# Patient Record
Sex: Female | Born: 1935 | Race: Black or African American | Hispanic: No | Smoking: Never smoker
Health system: Southern US, Community
[De-identification: ages and names within clinical notes are randomized; demographics above are authoritative.]

## PROBLEM LIST (undated history)

## (undated) DIAGNOSIS — F0390 Unspecified dementia without behavioral disturbance: Secondary | ICD-10-CM

## (undated) DIAGNOSIS — C259 Malignant neoplasm of pancreas, unspecified: Secondary | ICD-10-CM

## (undated) DIAGNOSIS — I10 Essential (primary) hypertension: Secondary | ICD-10-CM

## (undated) DIAGNOSIS — M1990 Unspecified osteoarthritis, unspecified site: Secondary | ICD-10-CM

## (undated) HISTORY — PX: SHOULDER SURGERY: SHX246

## (undated) HISTORY — PX: CHOLECYSTECTOMY: SHX55

## (undated) HISTORY — PX: ABDOMINAL HYSTERECTOMY: SHX81

---

## 2002-08-06 ENCOUNTER — Emergency Department (HOSPITAL_COMMUNITY): Admission: EM | Admit: 2002-08-06 | Discharge: 2002-08-07 | Payer: Self-pay | Admitting: Emergency Medicine

## 2002-08-07 ENCOUNTER — Encounter: Payer: Self-pay | Admitting: Emergency Medicine

## 2005-08-16 ENCOUNTER — Ambulatory Visit (HOSPITAL_COMMUNITY): Admission: RE | Admit: 2005-08-16 | Discharge: 2005-08-16 | Payer: Self-pay | Admitting: Family Medicine

## 2006-07-12 ENCOUNTER — Emergency Department (HOSPITAL_COMMUNITY): Admission: EM | Admit: 2006-07-12 | Discharge: 2006-07-12 | Payer: Self-pay | Admitting: Emergency Medicine

## 2007-02-10 ENCOUNTER — Emergency Department (HOSPITAL_COMMUNITY): Admission: EM | Admit: 2007-02-10 | Discharge: 2007-02-10 | Payer: Self-pay | Admitting: Emergency Medicine

## 2007-02-11 ENCOUNTER — Ambulatory Visit (HOSPITAL_COMMUNITY): Admission: RE | Admit: 2007-02-11 | Discharge: 2007-02-11 | Payer: Self-pay | Admitting: Family Medicine

## 2007-02-13 ENCOUNTER — Inpatient Hospital Stay (HOSPITAL_COMMUNITY): Admission: RE | Admit: 2007-02-13 | Discharge: 2007-02-14 | Payer: Self-pay | Admitting: Urgent Care

## 2007-02-13 ENCOUNTER — Ambulatory Visit: Payer: Self-pay | Admitting: Gastroenterology

## 2007-02-14 ENCOUNTER — Ambulatory Visit: Payer: Self-pay | Admitting: Gastroenterology

## 2007-02-24 ENCOUNTER — Ambulatory Visit: Payer: Self-pay | Admitting: Gastroenterology

## 2007-02-24 ENCOUNTER — Ambulatory Visit (HOSPITAL_COMMUNITY): Admission: RE | Admit: 2007-02-24 | Discharge: 2007-02-24 | Payer: Self-pay | Admitting: Gastroenterology

## 2007-03-26 ENCOUNTER — Ambulatory Visit: Payer: Self-pay | Admitting: Internal Medicine

## 2007-12-23 DIAGNOSIS — I10 Essential (primary) hypertension: Secondary | ICD-10-CM | POA: Insufficient documentation

## 2007-12-23 DIAGNOSIS — R634 Abnormal weight loss: Secondary | ICD-10-CM

## 2007-12-23 DIAGNOSIS — R1310 Dysphagia, unspecified: Secondary | ICD-10-CM | POA: Insufficient documentation

## 2007-12-23 DIAGNOSIS — K222 Esophageal obstruction: Secondary | ICD-10-CM | POA: Insufficient documentation

## 2008-01-13 DIAGNOSIS — R112 Nausea with vomiting, unspecified: Secondary | ICD-10-CM

## 2008-01-13 DIAGNOSIS — K219 Gastro-esophageal reflux disease without esophagitis: Secondary | ICD-10-CM | POA: Insufficient documentation

## 2008-01-13 DIAGNOSIS — Z8719 Personal history of other diseases of the digestive system: Secondary | ICD-10-CM

## 2008-01-13 DIAGNOSIS — N281 Cyst of kidney, acquired: Secondary | ICD-10-CM

## 2008-01-13 DIAGNOSIS — R109 Unspecified abdominal pain: Secondary | ICD-10-CM | POA: Insufficient documentation

## 2010-10-02 NOTE — Assessment & Plan Note (Signed)
NAMEMarland Kitchen  Sophia, Ramirez              CHART#:  [id number]   DATE:  03/26/2007                       DOB:  [birth year]   CHIEF COMPLAINT:  Followup EGD.   SUBJECTIVE:  The patient is a 77 year old female.  She was evaluated by  myself and Dr. Kassie Mends for weight loss, upper abdominal pain, nausea  and vomiting.  She had a CT scan on 02/13/2007.  She was found to have  intra and extrahepatic biliary dilatation, status post cholecystectomy  with tapering of the distal CBD to a slightly prominent ampulla.  Renal  cyst, nonspecific stranding of fat surrounding the SMA.  It was  suggested that she have EUS either in [place] or at [facility].  She has declined any further workup for CBD dilatation.  She did have an  EGD with Savary dilatation by Dr. Cira Servant on 02/14/2007.  Her distal  esophagus was narrowed to approximately 10 mm.  She had a difficult  Schatzki's ring which was successfully dilated to 12.8 mm with Savary  dilator.  She is due for followup EGD with esophageal dilatation at this  time however she is declining this.  She tells me she is doing great.  She denies any nausea or vomiting.  Denies any dysphagia or odynophagia.  Denies any abdominal pain.  She denies any rectal bleeding or melena.  She denies any anorexia or fatigue.  Overall she feels quite well.   CURRENT MEDICATIONS:  See the list from 03/26/2007.   ALLERGIES:  ANTIBIOTIC WHICH SHE CANNOT REMEMBER THE NAME.   OBJECTIVE:  VITAL SIGNS:  Weight 177 pounds.  Height 54 inches.  Temp  97.8.  Blood pressure 140/80.  Pulse 80.  GENERAL:  The patient is an elderly [place] American female who is  alert, oriented, pleasant, cooperative, in no acute distress.  HEENT:  Sclerae are clear, nonicteric.  Conjunctivae pink.  Oropharynx  pink and moist without any lesions.  HEART:  Regular rate and rhythm.  Normal S1 and S2.  ABDOMEN:  Positive bowel sounds x4.  No bruits auscultated.  Soft,  nontender, nondistended  without palpable mass or hepatosplenomegaly.  No  rebound, tenderness or guarding.  EXTREMITIES:  Without clubbing or edema bilaterally.  SKIN:  Warm and dry without any rash or jaundice.   ASSESSMENT:  1. Chronic complicated gastroesophageal reflux disease with critical      Schatzki's ring which was dilated by Dr. Cira Servant 02/14/2007.  The      patient has declined further dilatation at this time.  She tells me      if she has any further problems she will call us.  2. Her nausea and vomiting has resolved.  She is doing well on      Protonix 40 mg daily.  3. Common bile duct dilatation-likely benign papillary stenosis and      low likelihood of stone or mass.  Referral for EUS has been      canceled on the part of the patient as she is requesting no further      workup.   PLAN:  1. Continue Protonix 40 mg daily, #31 with 11 refills.  2. If she changes her mind regarding EUS or repeat EGD with dilatation      she is going to give [place] a call.  3. Otherwise, will follow up  with her in 1 year.       Lorenza Burton, N.P.  Electronically Signed     Kassie Mends, M.D.  Electronically Signed    KJ/MEDQ  D:  03/26/2007  T:  03/26/2007  Job:  [id number]   cc:   Deniece Portela A. Sheffield Slider, M.D.

## 2010-10-02 NOTE — Op Note (Signed)
NAMESAMIAH, RICKLEFS             ACCOUNT NO.:  [id number]   MEDICAL RECORD NO.:  [id number]          PATIENT TYPE:  AMB   LOCATION:  DAY                           FACILITY:  [facility]   PHYSICIAN:  Kassie Mends, M.D.      DATE OF BIRTH:  [birth year]   DATE OF PROCEDURE:  02/24/2007  DATE OF DISCHARGE:                               OPERATIVE REPORT   PROCEDURE:  Esophagastroduodenoscopy with Savary Dilation   REFERRING PHYSICIAN:  Annia Friendly. Loleta Chance, M.D.   INDICATIONS:  Mrs. Bouska is a 77 year old female who presented as an  outpatient for abdominal pain, heartburn and vomiting.  She had an upper  endoscopy on February 14, 2007 which showed a distal esophageal ring  which was dilated 12.8 mm.  She returns for subsequent dilation.   FINDINGS:  1. Distal esophagus narrowed to approximately 12 to 13 mm.  The distal      esophagus was successively dilated using the Savary dilators from      12.8 mm to 16 mm.  The dilators passed with mild resistance.      Otherwise no evidence of Barrett's erosions, mass, or ulceration      seen in the distal esophagus.  2. Normal stomach without evidence of erosion or ulcerations.  3. Normal duodenal bulb and second portion of the duodenum.   RECOMMENDATIONS:  1. She may progress to a regular diet.  2. She may use Protonix as needed for heartburn or indigestion.  3. No aspirin, NSAIDs or anticoagulation for 7 days.  4. Return patient visit with Lorenza Burton in 1 month to reassess      dysphagia and need for additional dilation.  5. Endoscopic ultrasound in [facility] for intrahepatic and      extrahepatic biliary dilation status post cholecystectomy.   MEDICATIONS:  1. Demerol IV.  2. Versed IV.   PROCEDURE TECHNIQUE:  Physical exam was performed.  Informed consent was  obtained from the patient after explaining the benefits, risks and  alternatives to the procedure.  The patient was connected to the monitor  and placed in the left lateral  position.  Continuous oxygen was provided  by nasal cannula and IV medicine administered through an indwelling  cannula.  After administration of sedation, the patient was intubated.  The scope was advanced under direct visualization to the second portion  of the duodenum.  The scope was removed slowly by carefully examining  the color, texture, anatomy, and integrity mucosa on the way out.  The  scope was advanced into the antrum and the Savary guidewire was  introduced.  The dilators were placed successively from 12.8 mm to 16  mm.  The scope was then removed.  Scope was withdrawn and the Savary  dilators were introduced over the guide wire.  The guidewire and the  Savary dilators were removed.  The patient was recovered in endoscopy  and discharged home in satisfactory condition.      Kassie Mends, M.D.  Electronically Signed     SM/MEDQ  D:  02/24/2007  T:  02/24/2007  Job:  [id number]   cc:  Barrie Folk. Berdine Addison, MD  Fax: [phone]

## 2010-10-02 NOTE — Discharge Summary (Signed)
Sophia Ramirez, Sophia Ramirez             ACCOUNT NO.:  [id number]   MEDICAL RECORD NO.:  [id number]          PATIENT TYPE:  INP   LOCATION:  A307                          FACILITY:  [facility]   PHYSICIAN:  Kassie Mends, M.D.      DATE OF BIRTH:  Dec 01, 1925   DATE OF ADMISSION:  02/13/2007  DATE OF DISCHARGE:  09/27/2008LH                               DISCHARGE SUMMARY   DISCHARGE DIAGNOSES:  1. Schatzki ring and dilated to 12.8 mm.  2. Dysphagia secondary to Schatzki ring.  3. Hypertension.  4. Cholecystectomy.   HISTORY OF PRESENT ILLNESS:  Sophia Ramirez is a 77 year old female who  presented with solid dysphagia and intermittent vomiting as well as  abdominal pain and weight loss.  She was admitted to the hospital  secondary to an inability to keep any p.o.'s down.   HOSPITAL COURSE:  She was admitted and kept n.p.o.  She was placed on IV  fluids and had antiemetics and pain medicine written for as needed.  Over the night she did not use any antiemetics or pain medicines.  She  had an EGD on the day of discharge which showed a distal Schatzki ring,  which was dilated to 12.8 mm.  Following dilation she was afebrile,  hemodynamically stable and had no chest pain.   DISCHARGE MEDICATIONS:  1..  She should avoid aspirin, NSAIDs,  anticoagulation until her dilations are complete.  1. Verapamil 240 mg in the morning and 120 mg in the evening.  2. Xanax 0.25 mg t.i.d. and as needed.  3. Ramipril 5 mg t.i.d.  4. Protonix 40 mg daily.  5. Phenergan 25 mg as needed.  6. Tylenol as needed.   DISCHARGE DIET:  She is asked to only eat meats if they are chopped,  ground or pulled.  She may otherwise follow a soft diet.      Kassie Mends, M.D.  Electronically Signed     SM/MEDQ  D:  02/16/2007  T:  02/16/2007  Job:  [id number]   cc:   Annia Friendly. Loleta Chance, MD  Fax: [phone]

## 2010-10-02 NOTE — Consult Note (Signed)
NAMESASHIA, CAMPAS             ACCOUNT NO.:  [id number]   MEDICAL RECORD NO.:  [id number]          PATIENT TYPE:  INP   LOCATION:  A307                          FACILITY:  [facility]   PHYSICIAN:  Kassie Mends, M.D.      DATE OF BIRTH:  May 04, 1926   DATE OF CONSULTATION:  DATE OF DISCHARGE:  02/14/2007                                 CONSULTATION   REQUESTING PHYSICIAN:  Dr. Loleta Chance.   REASON FOR CONSULTATION:  Severe upper abdominal pain, weight loss,  anorexia, early satiety, weakness.   HISTORY OF PRESENT ILLNESS:  Ms. Mcglown is a 77 year old African  American female.  She began to have severe upper abdominal pain Monday  evening.  She was seen by Dr. Loleta Chance 3 days ago; she was sent to the [facility] Emergency Room on February 11, 2007.  She had plain abdominal  films which were benign.  She had a normal CBC and normal LFTs and a  normal CMP, except for a glucose of 451.  She had a normal amylase and  lipase.  She was given Phenergan for the nausea and vomiting, as she had  had several episodes of nausea and vomiting throughout the week.  She  also complains of constant heartburn; she started Protonix 40 mg b.i.d.  2 days ago.  She has not noticed any change in her symptoms.  She tells  me she feels like my food won't go done.  She is having problems with  solids and feels as though they get stuck in her lower esophagus.  She  denies any problems with liquids.  She tells me has had dysphagia for  about a year now.  She had a bowel movement 2 days ago which was soft  and brown.  Abdominal film did show her colon was full of stool.  She  describes the pain currently as a dull ache to her epigastrium.  She has  lost 12 pounds in the last month.  She is only eating 2 meals a day this  month, but since she has had abdominal pain, her family says she has not  been able to keep down hardly anything.  Her pain is worse with eating.  She complains of significant bloating.  She also complains  of a mid back  pain which accompanies the epigastric pain.  She complains of early  satiety and anorexia.  She denies any fever or chills.   PAST MEDICAL AND SURGICAL HISTORY:  1. Hypertension.  2. Dislocated left shoulder with reduction.  3. Hysterectomy.  4. Cholecystectomy.  5. She had 2 tumors removed from her right temple.   CURRENT MEDICATIONS:  1. Verapamil 240 mg in the morning and 120 mg in the evening.  2. Xanax 0.25 mg t.i.d. p.r.n.  3. Ramipril 5 mg t.i.d.  4. Protonix 40 mg b.i.d.  5. Phenergan 25 mg p.r.n.  6. Tylenol p.r.n.   ALLERGIES:  She is allergic to an antibiotic that she cannot recall the  name.   FAMILY HISTORY:  There is no known family history of colorectal  carcinoma, liver or chronic GI  problems.   SOCIAL HISTORY:  Ms. Goding is a widow.  She lives alone.  She has 5 healthy children.  She has been a homemaker.  She denies any tobacco, alcohol or drug use.   REVIEW OF SYSTEMS:  See HPI.  GENERAL:  She complains of significant  weakness.  GU:  She was having some problems with dysuria.  Urinalysis  at [facility] showed small blood and trace leukocytes, no evidence of  UTI.  CARDIOVASCULAR:  She denies any chest pain or palpitations.  PULMONARY:  She denies any shortness of breath, dyspnea, cough or  hemoptysis.  GI:  See HPI.   PHYSICAL EXAMINATION:  VITAL SIGNS:  Weight 170 pounds, height 64  inches.  Temperature 98.2, blood pressure 100/64 and pulse 80.  GENERAL:  Ms. Apodaca is a pale African American who is alert,  oriented, pleasant and cooperative.  She is accompanied by her 2  daughters today.  She does appear quite weak.  HEENT:  Pupils are equal.  Sclerae anicteric.  Conjunctivae pink.  Oropharynx pink and moist.  She has upper dentures intact.  NECK:  Supple without any mass or thyromegaly.  CHEST:  Heart rate is irregular.  LUNGS:  Clear to auscultation bilaterally.  ABDOMEN:  Positive bowel sounds x4.  Slightly distended.  No  bruits  auscultated.  Soft.  She has significant tenderness to the epigastrium  and both upper right and left quadrants.  She has tenderness around the  umbilicus as well.  EXTREMITIES:  Without clubbing or edema bilaterally.  SKIN:  Pale, warm and dry without any rash or jaundice.   IMPRESSION:  Ms. Cales is a 77 year old African American female with  significant upper abdominal pain.  She also has a component of mid back  pain as well.  She complains of anorexia, early satiety, chronic  heartburn, dysphagia to solids, nausea and vomiting and a significant  weight loss.  Her symptoms are very concerning and she is going to need  further workup to rule out peptic ulcer disease, gastritis, occult  malignancy, esophageal web, rib or stricture, or occult malignant  process.  Referred back pain should remain in the differential as well.  I have talked with Dr. Cira Servant about Ms. Clovis Riley and our plan of care is  outlined below.   PLAN:  1. STAT CT today of abdomen and pelvis with IV and oral contrast.  2. MiraLax 17 g daily to begin after CT scan.  3. EGD with Dr. Cira Servant tomorrow morning.  I have discussed the      procedure including the risks and benefits including, but not      limited to, bleeding, infection, perforation and drug reaction.      She agrees and planned consent can be obtained.  4. Continue Protonix 40 mg b.i.d.  5. I have offered pain medications, but Ms. Tufte has declined.  6. Admit for hydration and vomiting and inability to take pos and will      have IV antiemetics and pain meds.  7. She is to continue Phenergan suppositories p.r.n. nausea and      vomiting as an outpatient.  8. Further recommendations to follow in the very near future.   We would like to thank Dr. Loleta Chance for allowing [place] to participate in the  care of Ms. Clovis Riley.      Lorenza Burton, N.P.      Kassie Mends, M.D.  Electronically Signed    KJ/MEDQ  D:  02/13/2007  T:  02/13/2007  Job:   [id number]   cc:   Annia Friendly. Loleta Chance, MD  Fax: [phone]

## 2010-10-02 NOTE — Op Note (Signed)
Sophia Ramirez, Sophia Ramirez             ACCOUNT NO.:  [id number]   MEDICAL RECORD NO.:  [id number]          PATIENT TYPE:  INP   LOCATION:  A307                          FACILITY:  [facility]   PHYSICIAN:  Kassie Mends, M.D.      DATE OF BIRTH:  1926/04/18   DATE OF PROCEDURE:  02/14/2007  DATE OF DISCHARGE:                               OPERATIVE REPORT   REFERRING PHYSICIAN:  Annia Friendly. Loleta Chance, M.D.   PROCEDURE:  Esophagogastroduodenoscopy with Savory dilation.   INDICATIONS FOR EXAMINATION:  Sophia Ramirez is a 77 year old female who  presented as an outpatient with abdominal pain, heartburn and vomiting.  She stated that she feels like the food will not go down. Her abdominal  pain is likely related to constipation, but her dysphagia is being  investigated by the upper endoscopy.   FINDINGS:  1. Distal esophagus narrowed to approximately 10 mm. She has a distal      Schatzki's ring which is the likely source of her dysphagia. It is      also the likely source of her vomiting. The distal esophagus was      dilated successively using the [facility] dilators from 11 mm to 12.8      mm. The dilators passed with mild resistance. Otherwise, no      evidence of Barrett's, erosions, mass or ulceration seen in the      distal esophagus.  2. Normal stomach without evidence of erosions or ulcerations.  3. Normal duodenal bulb and second portion of the duodenum.   RECOMMENDATIONS:  1. She should follow a soft diet. All meats should be chopped, ground,      or pulled.  2. She may reduce her Protonix to once daily.  3. No aspirin, NSAIDs, or anticoagulation until her dilation is      complete.  4. EGD after February 23, 2007 for subsequent dilation.   MEDICATIONS:  1. Demerol 50 mg IV.  2. Versed 2 mg IV.   PROCEDURE TECHNIQUE:  A physical exam was performed. Informed consent  was obtained from the patient after explaining the benefits, risks and  alternatives to the procedure.  The patient was connected  to the monitor  and placed in the left lateral position. Continuous oxygen was provided  by nasal cannula and IV medications administered through an indwelling  cannula. After administration of sedation, the patient was intubated and  the scope was advanced under direct visualization to the second portion  part of the duodenum. The scope was withdrawal slowly while carefully  examining the color, texture, anatomy and integrity on the way out. The  scope  was then advanced into the antrum, and the Savory guidewire was  introduced. The dilators were placed successively over the guidewire up  to 12.8 mm. The patient was recovered in endoscopy and discharged to the  floor in satisfactory condition.      Kassie Mends, M.D.  Electronically Signed     SM/MEDQ  D:  02/14/2007  T:  02/14/2007  Job:  [id number]   cc:   Annia Friendly. Loleta Chance, MD  Fax: [phone]

## 2011-02-28 LAB — CBC
HCT: 42.5
Hemoglobin: 14.2
MCHC: 33.5
MCHC: 33.9
MCV: 81.6
Platelets: 281
Platelets: 248
RBC: 5.2 — ABNORMAL HIGH
RDW: 13
RDW: 13.5
WBC: 7.7

## 2011-02-28 LAB — URINALYSIS, ROUTINE W REFLEX MICROSCOPIC
Bilirubin Urine: NEGATIVE
Glucose, UA: NEGATIVE
Ketones, ur: NEGATIVE
Nitrite: NEGATIVE
Nitrite: NEGATIVE
Protein, ur: NEGATIVE
Specific Gravity, Urine: <1.005 — ABNORMAL LOW
Specific Gravity, Urine: 1.017
Urobilinogen, UA: 0.2
Urobilinogen, UA: 0.2
pH: 6
pH: 7.5

## 2011-02-28 LAB — COMPREHENSIVE METABOLIC PANEL
ALT: 17
AST: 24
Albumin: 3.2 — ABNORMAL LOW
Alkaline Phosphatase: 46
BUN: 9
CO2: 31
Calcium: 8.6
Chloride: 85 — ABNORMAL LOW
Creatinine, Ser: 0.77
GFR calc Af Amer: >60
GFR calc non Af Amer: >60
Glucose, Bld: 87
Potassium: 3.4 — ABNORMAL LOW
Sodium: 123 — ABNORMAL LOW
Total Bilirubin: 0.8
Total Protein: 7.1

## 2011-02-28 LAB — DIFFERENTIAL
Basophils Absolute: 0.1
Basophils Absolute: 0
Basophils Relative: 1
Basophils Relative: 1
Eosinophils Absolute: 0.1
Eosinophils Relative: 1
Eosinophils Relative: 1
Lymphocytes Relative: 24
Lymphocytes Relative: 13
Lymphs Abs: 1.8
Monocytes Absolute: 1 — ABNORMAL HIGH
Monocytes Relative: 12 — ABNORMAL HIGH
Neutro Abs: 4.8
Neutro Abs: 4.7
Neutrophils Relative %: 62

## 2011-02-28 LAB — URINE MICROSCOPIC-ADD ON

## 2011-02-28 LAB — I-STAT 8, (EC8 V) (CONVERTED LAB)
Acid-Base Excess: 2
Chloride: 105
HCT: 41
Operator id: 265201
Potassium: 4.3
Sodium: 137

## 2011-02-28 LAB — POCT I-STAT CREATININE: Creatinine, Ser: 0.9

## 2011-02-28 LAB — HEPATIC FUNCTION PANEL
ALT: 12
AST: 24
Albumin: 3.8
Total Protein: 7.9

## 2011-02-28 LAB — AMYLASE: Amylase: 125

## 2012-08-01 ENCOUNTER — Emergency Department (HOSPITAL_COMMUNITY): Payer: Medicare Other

## 2012-08-01 ENCOUNTER — Encounter (HOSPITAL_COMMUNITY): Payer: Self-pay | Admitting: Emergency Medicine

## 2012-08-01 ENCOUNTER — Emergency Department (HOSPITAL_COMMUNITY)
Admission: EM | Admit: 2012-08-01 | Discharge: 2012-08-02 | Disposition: A | Discharge status: Home / Self Care | Payer: Medicare Other | Attending: Emergency Medicine | Admitting: Emergency Medicine

## 2012-08-01 DIAGNOSIS — Y92009 Unspecified place in unspecified non-institutional (private) residence as the place of occurrence of the external cause: Secondary | ICD-10-CM | POA: Insufficient documentation

## 2012-08-01 DIAGNOSIS — Z79899 Other long term (current) drug therapy: Secondary | ICD-10-CM | POA: Insufficient documentation

## 2012-08-01 DIAGNOSIS — S7002XA Contusion of left hip, initial encounter: Secondary | ICD-10-CM

## 2012-08-01 DIAGNOSIS — W010XXA Fall on same level from slipping, tripping and stumbling without subsequent striking against object, initial encounter: Secondary | ICD-10-CM | POA: Insufficient documentation

## 2012-08-01 DIAGNOSIS — Y9389 Activity, other specified: Secondary | ICD-10-CM | POA: Insufficient documentation

## 2012-08-01 DIAGNOSIS — W19XXXA Unspecified fall, initial encounter: Secondary | ICD-10-CM

## 2012-08-01 DIAGNOSIS — I10 Essential (primary) hypertension: Secondary | ICD-10-CM | POA: Insufficient documentation

## 2012-08-01 DIAGNOSIS — M25559 Pain in unspecified hip: Secondary | ICD-10-CM | POA: Insufficient documentation

## 2012-08-01 DIAGNOSIS — S7000XA Contusion of unspecified hip, initial encounter: Secondary | ICD-10-CM | POA: Insufficient documentation

## 2012-08-01 HISTORY — DX: Essential (primary) hypertension: I10

## 2012-08-01 NOTE — ED Notes (Signed)
Pt st's she fell on Wed.  Has had pain with bruise in left hip since the fall.

## 2012-08-01 NOTE — ED Provider Notes (Signed)
History     CSN: [id number]  Arrival date & time 08/01/12  2103   First MD Initiated Contact with Patient 08/01/12 2301      Chief Complaint  Patient presents with  . Hip Pain    (Consider location/radiation/quality/duration/timing/severity/associated sxs/prior treatment) Patient is a 79 y.o. female presenting with hip pain. The history is provided by the patient and a relative.  Hip Pain  She slipped and fell 3 days ago and injured her left hip. She was initially able to be ambulatory but today she was unable to cannulate. She states it is worse when she tries to stand up. Pain is severe and she rates it at 9/10. It is better she is staying still. She denies other injury. She has not taken any medication for pain.  Past Medical History  Diagnosis Date  . Hypertension     Past Surgical History  Procedure Laterality Date  . Shoulder surgery    . Cholecystectomy    . Abdominal hysterectomy      No family history on file.  History  Substance Use Topics  . Smoking status: Never Smoker   . Smokeless tobacco: Not on file  . Alcohol Use: No    OB History   Grav Para Term Preterm Abortions TAB SAB Ect Mult Living                  Review of Systems  All other systems reviewed and are negative.    Allergies  Other  Home Medications   Current Outpatient Rx  Name  Route  Sig  Dispense  Refill  . acetaminophen (TYLENOL) 500 MG tablet   Oral   Take 500 mg by mouth every 6 (six) hours as needed for pain.         Marland Kitchen ALPRAZolam (XANAX) 0.25 MG tablet   Oral   Take 0.25 mg by mouth 3 (three) times daily.         . ramipril (ALTACE) 2.5 MG tablet   Oral   Take 2.5 mg by mouth daily.         Marland Kitchen VERAPAMIL HCL ER, CO, PO   Oral   Take 240 mg by mouth daily.           BP 129/55  Pulse 72  Temp(Src) 98.5 F (36.9 C) (Oral)  Resp 16  SpO2 96%  Physical Exam  Nursing note and vitals reviewed.  79 year old female, resting comfortably and in no acute  distress. Vital signs are normal. Oxygen saturation is 96%, which is normal. Head is normocephalic and atraumatic. PERRLA, EOMI. Oropharynx is clear. Neck is nontender and supple without adenopathy or JVD. Back is nontender and there is no CVA tenderness. Lungs are clear without rales, wheezes, or rhonchi. Chest is nontender. Heart has regular rate and rhythm without murmur. Abdomen is soft, flat, nontender without masses or hepatosplenomegaly and peristalsis is normoactive. Extremities have no cyanosis or edema. There is moderate tenderness to the posterior aspect of the left hip. There is pain elicited with log roll of the left leg but when the knee is flexed there is full internal and external rotation without pain and there is full flexion and extension without pain. Skin is warm and dry without rash. Neurologic: Mental status is normal, cranial nerves are intact, there are no motor or sensory deficits.  ED Course  Procedures (including critical care time)  Dg Hip Complete Left  08/01/2012  *RADIOLOGY REPORT*  Clinical Data: Increasing left  hip pain since a fall 4 days ago.  LEFT HIP - COMPLETE 2+ VIEW  Comparison: None.  Findings: Degenerative changes in the lower lumbar spine and both hips.  Partial sacralization of L5 on the left.  The left hip appears intact.  No evidence of acute fracture or subluxation.  No focal bone lesion or bone destruction.  Bone cortex and trabecular architecture appear intact.  The pelvic rim, SI joints, and symphysis pubis appear intact.  Calcified phleboliths in the pelvis.  Soft tissue calcifications over the hips may represent dystrophic calcification or injection sites.  Vascular calcifications.  IMPRESSION: Degenerative changes in the lower lumbar spine and hips.  No displaced fractures identified.   Original Report Authenticated By: Burman Nieves, M.D.    Ct Pelvis Wo Contrast  08/02/2012  *RADIOLOGY REPORT*  Clinical Data: Left hip pain after fall on  Wednesday.  CT PELVIS WITHOUT CONTRAST  Technique:  Multidetector CT imaging of the pelvis was performed following the standard protocol without intravenous contrast.  Comparison: Left hip radiograph 08/01/2012.  CT abdomen and pelvis 02/13/2007.  Findings: The bony pelvis, sacrum, SI joints, symphysis pubis, pubic rami, and hips appear intact.  No displaced fractures are identified.  There is slight fragmentation of the anterior aspect of the right sacral ala, stable since previous study and consistent with degenerative change.  There is a subcutaneous soft tissue hematoma measuring 3.4 x 6.5 cm lateral to the left greater trochanter.  Degenerative changes in the hips.  Calcified granulomas in the soft tissues over the gluteal regions. Pelvic organs appear intact.  IMPRESSION: Hematoma in the subcutaneous soft tissues lateral to the greater trochanter of the left hip.  No displaced fractures identified. Degenerative changes.   Original Report Authenticated By: Burman Nieves, M.D.    Images viewed by me.   1. Fall at home, initial encounter   2. Contusion of left hip, initial encounter       MDM  Fall with injury to the left sacral area. I reviewed her x-rays and there is no evidence of fracture. She will be sent for CT to look for occult fracture.  CT shows evidence of hematoma but no fracture. Patient does admit that she normally uses a cane to walk but she did not have her cane when she fell this time. She's given a prescription for a walker but she seems resistant to using it. I've stressed for her that if she does not use the walker, she needs to use a cane at all times. Prescription is given for tramadol for pain.      Dione Booze, MD 08/02/12 0230

## 2012-08-02 ENCOUNTER — Emergency Department (HOSPITAL_COMMUNITY): Payer: Medicare Other

## 2012-08-02 MED ORDER — TRAMADOL HCL 50 MG PO TABS: 50.0000 mg | ORAL_TABLET | Freq: Four times a day (QID) | ORAL | Status: DC | PRN

## 2012-08-02 MED ORDER — ACETAMINOPHEN 325 MG PO TABS
650.0000 mg | ORAL_TABLET | Freq: Once | ORAL | Status: AC
  Administered 2012-08-02: 650 mg via ORAL
  Filled 2012-08-02: qty 2

## 2012-08-02 NOTE — ED Notes (Signed)
Patient transported to CT 

## 2015-11-20 ENCOUNTER — Inpatient Hospital Stay (HOSPITAL_COMMUNITY): Payer: Medicare Other

## 2015-11-20 ENCOUNTER — Encounter (HOSPITAL_COMMUNITY): Payer: Self-pay

## 2015-11-20 ENCOUNTER — Inpatient Hospital Stay (HOSPITAL_COMMUNITY)
Admission: EM | Admit: 2015-11-20 | Discharge: 2015-11-26 | DRG: 435 | Disposition: A | Discharge status: Skilled Nursing Facility | Payer: Medicare Other | Attending: Internal Medicine | Admitting: Internal Medicine

## 2015-11-20 ENCOUNTER — Emergency Department (HOSPITAL_COMMUNITY): Payer: Medicare Other

## 2015-11-20 DIAGNOSIS — Z800 Family history of malignant neoplasm of digestive organs: Secondary | ICD-10-CM

## 2015-11-20 DIAGNOSIS — W19XXXA Unspecified fall, initial encounter: Secondary | ICD-10-CM | POA: Diagnosis not present

## 2015-11-20 DIAGNOSIS — M1990 Unspecified osteoarthritis, unspecified site: Secondary | ICD-10-CM | POA: Diagnosis present

## 2015-11-20 DIAGNOSIS — C259 Malignant neoplasm of pancreas, unspecified: Secondary | ICD-10-CM | POA: Diagnosis present

## 2015-11-20 DIAGNOSIS — Z9049 Acquired absence of other specified parts of digestive tract: Secondary | ICD-10-CM

## 2015-11-20 DIAGNOSIS — R7989 Other specified abnormal findings of blood chemistry: Secondary | ICD-10-CM | POA: Diagnosis not present

## 2015-11-20 DIAGNOSIS — K222 Esophageal obstruction: Secondary | ICD-10-CM | POA: Diagnosis not present

## 2015-11-20 DIAGNOSIS — C787 Secondary malignant neoplasm of liver and intrahepatic bile duct: Secondary | ICD-10-CM | POA: Diagnosis present

## 2015-11-20 DIAGNOSIS — Z79899 Other long term (current) drug therapy: Secondary | ICD-10-CM

## 2015-11-20 DIAGNOSIS — E871 Hypo-osmolality and hyponatremia: Secondary | ICD-10-CM | POA: Diagnosis present

## 2015-11-20 DIAGNOSIS — Z515 Encounter for palliative care: Secondary | ICD-10-CM | POA: Insufficient documentation

## 2015-11-20 DIAGNOSIS — R079 Chest pain, unspecified: Secondary | ICD-10-CM | POA: Insufficient documentation

## 2015-11-20 DIAGNOSIS — R748 Abnormal levels of other serum enzymes: Secondary | ICD-10-CM | POA: Diagnosis present

## 2015-11-20 DIAGNOSIS — E222 Syndrome of inappropriate secretion of antidiuretic hormone: Secondary | ICD-10-CM | POA: Diagnosis present

## 2015-11-20 DIAGNOSIS — Z7189 Other specified counseling: Secondary | ICD-10-CM | POA: Diagnosis not present

## 2015-11-20 DIAGNOSIS — R7401 Elevation of levels of liver transaminase levels: Secondary | ICD-10-CM | POA: Insufficient documentation

## 2015-11-20 DIAGNOSIS — C7801 Secondary malignant neoplasm of right lung: Secondary | ICD-10-CM | POA: Diagnosis present

## 2015-11-20 DIAGNOSIS — I10 Essential (primary) hypertension: Secondary | ICD-10-CM | POA: Diagnosis present

## 2015-11-20 DIAGNOSIS — R945 Abnormal results of liver function studies: Secondary | ICD-10-CM

## 2015-11-20 DIAGNOSIS — E860 Dehydration: Secondary | ICD-10-CM | POA: Diagnosis present

## 2015-11-20 DIAGNOSIS — D490 Neoplasm of unspecified behavior of digestive system: Secondary | ICD-10-CM | POA: Diagnosis not present

## 2015-11-20 DIAGNOSIS — R740 Nonspecific elevation of levels of transaminase and lactic acid dehydrogenase [LDH]: Secondary | ICD-10-CM | POA: Diagnosis not present

## 2015-11-20 DIAGNOSIS — Z66 Do not resuscitate: Secondary | ICD-10-CM | POA: Diagnosis present

## 2015-11-20 DIAGNOSIS — K831 Obstruction of bile duct: Secondary | ICD-10-CM | POA: Diagnosis present

## 2015-11-20 DIAGNOSIS — C250 Malignant neoplasm of head of pancreas: Secondary | ICD-10-CM | POA: Diagnosis not present

## 2015-11-20 DIAGNOSIS — R17 Unspecified jaundice: Secondary | ICD-10-CM | POA: Diagnosis not present

## 2015-11-20 HISTORY — DX: Unspecified osteoarthritis, unspecified site: M19.90

## 2015-11-20 LAB — SALICYLATE LEVEL: SALICYLATE LVL: 14.6 mg/dL (ref 2.8–30.0)

## 2015-11-20 LAB — COMPREHENSIVE METABOLIC PANEL
ALBUMIN: 2.9 g/dL — AB (ref 3.5–5.0)
ALK PHOS: 454 U/L — AB (ref 38–126)
ALT: 172 U/L — AB (ref 14–54)
ALT: 153 U/L — ABNORMAL HIGH (ref 14–54)
ANION GAP: 13 (ref 5–15)
AST: 245 U/L — AB (ref 15–41)
AST: 212 U/L — ABNORMAL HIGH (ref 15–41)
Albumin: 2.5 g/dL — ABNORMAL LOW (ref 3.5–5.0)
Alkaline Phosphatase: 406 U/L — ABNORMAL HIGH (ref 38–126)
Anion gap: 15 (ref 5–15)
BILIRUBIN TOTAL: 24.2 mg/dL — AB (ref 0.3–1.2)
BUN: 20 mg/dL (ref 6–20)
BUN: 17 mg/dL (ref 6–20)
CALCIUM: 8.4 mg/dL — AB (ref 8.9–10.3)
CO2: 25 mmol/L (ref 22–32)
CO2: 27 mmol/L (ref 22–32)
Calcium: 8 mg/dL — ABNORMAL LOW (ref 8.9–10.3)
Chloride: 82 mmol/L — ABNORMAL LOW (ref 101–111)
Chloride: 85 mmol/L — ABNORMAL LOW (ref 101–111)
Creatinine, Ser: <0.3 mg/dL — ABNORMAL LOW (ref 0.44–1.00)
GLUCOSE: 155 mg/dL — AB (ref 65–99)
Glucose, Bld: 109 mg/dL — ABNORMAL HIGH (ref 65–99)
POTASSIUM: 3.1 mmol/L — AB (ref 3.5–5.1)
Potassium: 3.4 mmol/L — ABNORMAL LOW (ref 3.5–5.1)
SODIUM: 122 mmol/L — AB (ref 135–145)
Sodium: 125 mmol/L — ABNORMAL LOW (ref 135–145)
TOTAL PROTEIN: 6.6 g/dL (ref 6.5–8.1)
Total Bilirubin: 27.7 mg/dL (ref 0.3–1.2)
Total Protein: 7.4 g/dL (ref 6.5–8.1)

## 2015-11-20 LAB — AMMONIA: Ammonia: 33 umol/L (ref 9–35)

## 2015-11-20 LAB — CBC WITH DIFFERENTIAL/PLATELET
BASOS ABS: 0 10*3/uL (ref 0.0–0.1)
Basophils Relative: 0 %
EOS ABS: 0 10*3/uL (ref 0.0–0.7)
Eosinophils Relative: 0 %
HCT: 30.9 % — ABNORMAL LOW (ref 36.0–46.0)
HEMOGLOBIN: 11.1 g/dL — AB (ref 12.0–15.0)
LYMPHS ABS: 1 10*3/uL (ref 0.7–4.0)
LYMPHS PCT: 9 %
MCH: 28.8 pg (ref 26.0–34.0)
MCHC: 35.9 g/dL (ref 30.0–36.0)
MCV: 80.3 fL (ref 78.0–100.0)
Monocytes Absolute: 0.8 10*3/uL (ref 0.1–1.0)
Monocytes Relative: 7 %
NEUTROS PCT: 84 %
Neutro Abs: 8.5 10*3/uL — ABNORMAL HIGH (ref 1.7–7.7)
Platelets: 447 10*3/uL — ABNORMAL HIGH (ref 150–400)
RBC: 3.85 MIL/uL — AB (ref 3.87–5.11)
RDW: 17.4 % — ABNORMAL HIGH (ref 11.5–15.5)
WBC: 10.2 10*3/uL (ref 4.0–10.5)

## 2015-11-20 LAB — CORTISOL: Cortisol, Plasma: 27.6 ug/dL

## 2015-11-20 LAB — PROTIME-INR
INR: 1.2 (ref 0.00–1.49)
PROTHROMBIN TIME: 15.4 s — AB (ref 11.6–15.2)

## 2015-11-20 LAB — T4, FREE: Free T4: 0.94 ng/dL (ref 0.61–1.12)

## 2015-11-20 LAB — LIPASE, BLOOD: Lipase: 30 U/L (ref 11–51)

## 2015-11-20 LAB — CBC
HEMATOCRIT: 28.3 % — AB (ref 36.0–46.0)
Hemoglobin: 10.1 g/dL — ABNORMAL LOW (ref 12.0–15.0)
MCH: 28.6 pg (ref 26.0–34.0)
MCHC: 35.7 g/dL (ref 30.0–36.0)
MCV: 80.2 fL (ref 78.0–100.0)
Platelets: 399 10*3/uL (ref 150–400)
RBC: 3.53 MIL/uL — ABNORMAL LOW (ref 3.87–5.11)
RDW: 17.8 % — AB (ref 11.5–15.5)
WBC: 9.2 10*3/uL (ref 4.0–10.5)

## 2015-11-20 LAB — TROPONIN I
TROPONIN I: 0.05 ng/mL — AB (ref <0.03)
Troponin I: 0.04 ng/mL (ref <0.03)
Troponin I: 0.05 ng/mL (ref <0.03)
Troponin I: 0.06 ng/mL (ref <0.03)

## 2015-11-20 LAB — ACETAMINOPHEN LEVEL: Acetaminophen (Tylenol), Serum: <10 ug/mL — ABNORMAL LOW (ref 10–30)

## 2015-11-20 LAB — BASIC METABOLIC PANEL
ANION GAP: 12 (ref 5–15)
BUN: 15 mg/dL (ref 6–20)
CO2: 26 mmol/L (ref 22–32)
Calcium: 7.9 mg/dL — ABNORMAL LOW (ref 8.9–10.3)
Chloride: 85 mmol/L — ABNORMAL LOW (ref 101–111)
Creatinine, Ser: <0.3 mg/dL — ABNORMAL LOW (ref 0.44–1.00)
Glucose, Bld: 112 mg/dL — ABNORMAL HIGH (ref 65–99)
POTASSIUM: 3.2 mmol/L — AB (ref 3.5–5.1)
SODIUM: 123 mmol/L — AB (ref 135–145)

## 2015-11-20 LAB — TSH: TSH: 0.897 u[IU]/mL (ref 0.350–4.500)

## 2015-11-20 LAB — ETHANOL

## 2015-11-20 LAB — CK: Total CK: 222 U/L (ref 38–234)

## 2015-11-20 LAB — BILIRUBIN, DIRECT: Bilirubin, Direct: 15 mg/dL — ABNORMAL HIGH (ref 0.1–0.5)

## 2015-11-20 MED ORDER — SODIUM CHLORIDE 0.9% FLUSH
3.0000 mL | Freq: Two times a day (BID) | INTRAVENOUS | Status: DC
  Administered 2015-11-20 – 2015-11-26 (×8): 3 mL via INTRAVENOUS

## 2015-11-20 MED ORDER — SODIUM CHLORIDE 0.9 % IV SOLN
INTRAVENOUS | Status: DC
  Administered 2015-11-20: 02:00:00 via INTRAVENOUS

## 2015-11-20 MED ORDER — POTASSIUM CHLORIDE CRYS ER 20 MEQ PO TBCR
40.0000 meq | EXTENDED_RELEASE_TABLET | Freq: Once | ORAL | Status: AC
Start: 2015-11-20 — End: 2015-11-20
  Administered 2015-11-20: 40 meq via ORAL
  Filled 2015-11-20: qty 2

## 2015-11-20 MED ORDER — SODIUM CHLORIDE 0.9 % IV SOLN
INTRAVENOUS | Status: DC
  Administered 2015-11-20: 06:00:00 via INTRAVENOUS

## 2015-11-20 MED ORDER — ALPRAZOLAM 0.25 MG PO TABS
0.2500 mg | ORAL_TABLET | Freq: Three times a day (TID) | ORAL | Status: DC
  Administered 2015-11-20 – 2015-11-26 (×18): 0.25 mg via ORAL
  Filled 2015-11-20 (×19): qty 1

## 2015-11-20 MED ORDER — VERAPAMIL HCL ER 240 MG PO TBCR
240.0000 mg | EXTENDED_RELEASE_TABLET | Freq: Every day | ORAL | Status: DC
  Administered 2015-11-20 – 2015-11-26 (×7): 240 mg via ORAL
  Filled 2015-11-20 (×7): qty 1

## 2015-11-20 MED ORDER — IOPAMIDOL (ISOVUE-300) INJECTION 61%
100.0000 mL | Freq: Once | INTRAVENOUS | Status: AC | PRN
  Administered 2015-11-20: 100 mL via INTRAVENOUS

## 2015-11-20 MED ORDER — POTASSIUM CHLORIDE IN NACL 40-0.9 MEQ/L-% IV SOLN
INTRAVENOUS | Status: DC
  Administered 2015-11-20 – 2015-11-26 (×11): 100 mL/h via INTRAVENOUS

## 2015-11-20 MED ORDER — DIATRIZOATE MEGLUMINE & SODIUM 66-10 % PO SOLN
ORAL | Status: AC
  Filled 2015-11-20: qty 30

## 2015-11-20 MED ORDER — RAMIPRIL 1.25 MG PO CAPS
2.5000 mg | ORAL_CAPSULE | Freq: Every day | ORAL | Status: DC
  Administered 2015-11-20 – 2015-11-26 (×7): 2.5 mg via ORAL
  Filled 2015-11-20 (×7): qty 2

## 2015-11-20 MED ORDER — ENOXAPARIN SODIUM 40 MG/0.4ML ~~LOC~~ SOLN
40.0000 mg | SUBCUTANEOUS | Status: DC
  Administered 2015-11-20: 40 mg via SUBCUTANEOUS
  Filled 2015-11-20: qty 0.4

## 2015-11-20 NOTE — H&P (Addendum)
TRH H&P   Patient Demographics:    Sophia Ramirez, is a 82 y.o. female  MRN: [id number]   DOB - [birth year]  Admit Date - 11/20/2015  Outpatient Primary MD for the patient is [name], MD  Referring MD/NP/PA: Dr Ward  Patient coming from: home   Chief Complaint  Patient presents with  . Fall      HPI:    Sophia Ramirez  is a 82 y.o. female, With history of hypertension, status post cholecystectomy who was brought to the ED after patient was found on the floor. As per daughter patient was underfilled both third 49 hours. She was last seen on Saturday night. Patient stated that she was going to get up from the air conditioning off in her room and stripped out of the bed onto the floor. Family said they were able to get off the floor and put her back in bed but she has not ambulated. Patient denies pain at this time. There is no history of any injury. Patient skin is yellow, with severe jaundice. Family says that she has gotten worse over the past few days. No history of nausea vomiting. No diarrhea. No chest pain or shortness of breath. No history of alcohol abuse denies history of hepatitis. Lab work in the ED revealed hyponatremia, transaminitis and total bilirubin elevated 27.7    Review of systems:    In addition to the HPI above,  No Fever-chills, No Headache, No changes with Vision or hearing, No problems swallowing food or Liquids, No Chest pain, Cough or Shortness of Breath, No Abdominal pain, No Nausea or Vommitting, Bowel movements are regular, No Blood in stool or Urine, No dysuria, No new joints pains-aches,  No new weakness, tingling, numbness in any extremity, No recent weight gain or loss, No polyuria, polydypsia or polyphagia, No significant Mental Stressors.  A full 10 point Review of Systems was done, except as stated above, all other Review of Systems were  negative.   With Past History of the following :    Past Medical History  Diagnosis Date  . Hypertension   . Arthritis       Past Surgical History  Procedure Laterality Date  . Shoulder surgery    . Cholecystectomy    . Abdominal hysterectomy        Social History:     Social History  Substance Use Topics  . Smoking status: Never Smoker   . Smokeless tobacco: Not on file  . Alcohol Use: No        Family History :   Non contributory   Home Medications:   Prior to Admission medications   Medication Sig Start Date End Date Taking? Authorizing Provider  acetaminophen (TYLENOL) 500 MG tablet Take 500 mg by mouth every 6 (six) hours as needed for pain.    Historical Provider, MD  ALPRAZolam Duanne Moron) 0.25 MG tablet Take 0.25 mg by mouth 3 (three) times daily.    Historical Provider, MD  ramipril (ALTACE) 2.5 MG  tablet Take 2.5 mg by mouth daily.    Historical Provider, MD  traMADol (ULTRAM) 50 MG tablet Take 1 tablet (50 mg total) by mouth every 6 (six) hours as needed for pain. [id number]   Delora Fuel, MD  VERAPAMIL HCL ER, CO, PO Take 240 mg by mouth daily.    Historical Provider, MD     Allergies:    No Known Allergies   Physical Exam:   Vitals  Blood pressure 156/58, pulse 65, temperature 97.8 F (36.6 C), temperature source Oral, resp. rate 23, height 5\' 5"  (1.651 m), weight 62.596 kg (138 lb), SpO2 100 %.   1. General elderly female  lying in bed in NAD, + Icterus, cooperative with exam  2. Normal affect and insight, Not Suicidal or Homicidal, Awake Alert, Oriented X 3.  3. No F.N deficits, ALL C.Nerves Intact, Strength 5/5 all 4 extremities, Sensation intact all 4 extremities, Plantars down going.  4. Ears and Eyes appear Normal, Conjunctivae clear, PERRLA. Moist Oral Mucosa.  5. Supple Neck, No JVD, No cervical lymphadenopathy appriciated, No Carotid Bruits.  6. Symmetrical Chest wall movement, Good air movement bilaterally, CTAB.  7. RRR, No  Gallops, Rubs or Murmurs, No Parasternal Heave.  8. Positive Bowel Sounds, Abdomen Soft, No tenderness, No organomegaly appriciated,No rebound -guarding or rigidity.  9.  No Cyanosis, Normal Skin Turgor, No Skin Rash or Bruise.  10. Good muscle tone,  joints appear normal , no effusions, Normal ROM.      Data Review:    CBC  Recent Labs Lab 11/20/15 0110  WBC 10.2  HGB 11.1*  HCT 30.9*  PLT 447*  MCV 80.3  MCH 28.8  MCHC 35.9  RDW 17.4*  LYMPHSABS 1.0  MONOABS 0.8  EOSABS 0.0  BASOSABS 0.0   ------------------------------------------------------------------------------------------------------------------  Chemistries   Recent Labs Lab 11/20/15 0110  NA 122*  K 3.4*  CL 82*  CO2 25  GLUCOSE 155*  BUN 20  CREATININE <0.30*  CALCIUM 8.4*  AST 245*  ALT 172*  ALKPHOS 454*  BILITOT 27.7*   ------------------------------------------------------------------------------------------------------------------ CrCl cannot be calculated (Patient has no serum creatinine result on file.). ------------------------------------------------------------------------------------------------------------------ No results for input(s): TSH, T4TOTAL, T3FREE, THYROIDAB in the last 72 hours.  Invalid input(s): FREET3  Coagulation profile  Recent Labs Lab 11/20/15 0110  INR 1.20   ------------------------------------------------------------------------------------------------------------------- No results for input(s): DDIMER in the last 72 hours. -------------------------------------------------------------------------------------------------------------------  Cardiac Enzymes  Recent Labs Lab 11/20/15 0110  TROPONINI 0.04*   ------------------------------------------------------------------------------------------------------------------ No results found for:  BNP   ---------------------------------------------------------------------------------------------------------------  Urinalysis    Component Value Date/Time   COLORURINE YELLOW 02/13/2007 [place] 02/13/2007 1604   LABSPEC <1.005* 02/13/2007 1604   PHURINE 6.0 02/13/2007 [place] 02/13/2007 1604   HGBUR TRACE* 02/13/2007 [place] 02/13/2007 1604   KETONESUR TRACE* 02/13/2007 1604   PROTEINUR NEGATIVE 02/13/2007 1604   UROBILINOGEN 0.2 02/13/2007 1604   NITRITE NEGATIVE 02/13/2007 1604   LEUKOCYTESUR SMALL* 02/13/2007 1604    ----------------------------------------------------------------------------------------------------------------   Imaging Results:    Dg Chest 1 View  11/20/2015  CLINICAL DATA:  Patient found on floor.  Initial encounter. EXAM: CHEST 1 VIEW COMPARISON:  Chest radiograph performed 07/12/2006 FINDINGS: The lungs are well-aerated and clear. There is no evidence of focal opacification, pleural effusion or pneumothorax. The cardiomediastinal silhouette is borderline normal in size. No acute osseous abnormalities are seen. Mild degenerative change is noted at the left glenohumeral joint. IMPRESSION: No acute cardiopulmonary  process seen. Electronically Signed   By: Garald Balding M.D.   On: 11/20/2015 02:46   Ct Head Wo Contrast  11/20/2015  CLINICAL DATA:  Patient found on floor. Concern for head or cervical spine injury. Initial encounter. EXAM: CT HEAD WITHOUT CONTRAST CT CERVICAL SPINE WITHOUT CONTRAST TECHNIQUE: Multidetector CT imaging of the head and cervical spine was performed following the standard protocol without intravenous contrast. Multiplanar CT image reconstructions of the cervical spine were also generated. COMPARISON:  None. FINDINGS: CT HEAD FINDINGS There is no evidence of acute infarction, mass lesion, or intra- or extra-axial hemorrhage on CT. Prominence of the ventricles and sulci reflects mild  cortical volume loss. Mild cerebellar atrophy is noted. Scattered periventricular and subcortical matter change likely reflects small vessel ischemic microangiopathy. The brainstem and fourth ventricle are within normal limits. The basal ganglia are unremarkable in appearance. The cerebral hemispheres demonstrate grossly normal gray-white differentiation. No mass effect or midline shift is seen. There is no evidence of fracture; visualized osseous structures are unremarkable in appearance. The orbits are within normal limits. The paranasal sinuses and mastoid air cells are well-aerated. No significant soft tissue abnormalities are seen. CT CERVICAL SPINE FINDINGS There is no evidence of acute fracture or subluxation. Vertebral bodies demonstrate normal height. Mild grade 1 anterolisthesis is noted of C3 on C4 and of C4 on C5. Multilevel disc space narrowing is noted along the cervical spine, with scattered anterior and posterior disc osteophyte complexes. Prevertebral soft tissues are within normal limits. Facet disease is noted along the cervical spine. The thyroid gland is unremarkable in appearance. The visualized lung apices are clear. Scattered calcification is noted at the carotid bifurcations bilaterally. IMPRESSION: 1. No evidence of traumatic intracranial injury or fracture. 2. No evidence of acute fracture or subluxation along the cervical spine. 3. Mild cortical volume loss and scattered small vessel ischemic microangiopathy. 4. Mild degenerative change along the cervical spine. 5. Scattered calcification at the carotid bifurcations bilaterally. Carotid ultrasound would be helpful for further evaluation, when and as deemed clinically appropriate. Electronically Signed   By: Garald Balding M.D.   On: 11/20/2015 02:54   Ct Cervical Spine Wo Contrast  11/20/2015  CLINICAL DATA:  Patient found on floor. Concern for head or cervical spine injury. Initial encounter. EXAM: CT HEAD WITHOUT CONTRAST CT CERVICAL  SPINE WITHOUT CONTRAST TECHNIQUE: Multidetector CT imaging of the head and cervical spine was performed following the standard protocol without intravenous contrast. Multiplanar CT image reconstructions of the cervical spine were also generated. COMPARISON:  None. FINDINGS: CT HEAD FINDINGS There is no evidence of acute infarction, mass lesion, or intra- or extra-axial hemorrhage on CT. Prominence of the ventricles and sulci reflects mild cortical volume loss. Mild cerebellar atrophy is noted. Scattered periventricular and subcortical matter change likely reflects small vessel ischemic microangiopathy. The brainstem and fourth ventricle are within normal limits. The basal ganglia are unremarkable in appearance. The cerebral hemispheres demonstrate grossly normal gray-white differentiation. No mass effect or midline shift is seen. There is no evidence of fracture; visualized osseous structures are unremarkable in appearance. The orbits are within normal limits. The paranasal sinuses and mastoid air cells are well-aerated. No significant soft tissue abnormalities are seen. CT CERVICAL SPINE FINDINGS There is no evidence of acute fracture or subluxation. Vertebral bodies demonstrate normal height. Mild grade 1 anterolisthesis is noted of C3 on C4 and of C4 on C5. Multilevel disc space narrowing is noted along the cervical spine, with scattered anterior and posterior disc osteophyte  complexes. Prevertebral soft tissues are within normal limits. Facet disease is noted along the cervical spine. The thyroid gland is unremarkable in appearance. The visualized lung apices are clear. Scattered calcification is noted at the carotid bifurcations bilaterally. IMPRESSION: 1. No evidence of traumatic intracranial injury or fracture. 2. No evidence of acute fracture or subluxation along the cervical spine. 3. Mild cortical volume loss and scattered small vessel ischemic microangiopathy. 4. Mild degenerative change along the  cervical spine. 5. Scattered calcification at the carotid bifurcations bilaterally. Carotid ultrasound would be helpful for further evaluation, when and as deemed clinically appropriate. Electronically Signed   By: Garald Balding M.D.   On: 11/20/2015 02:54    My personal review of EKG: Normal sinus rhythm   Assessment & Plan:    Active Problems:   Hyponatremia     1. Hyponatremia- likely from dehydration, continue normal saline at 100 mL per hour. Will recheck BMP at 10 AM today 2. Elevated LFTs- patient has transaminitis with elevated AST, ALT, alkaline phosphatase, bilirubin elevated to 27.7. Check CT abdomen and pelvis to rule out underlying malignancy. 3. Hypertension- continue Altace , verapamil. 4. Elevated troponin- troponin is 0.04, total CK is 222. Will obtain serial troponin. Patient has no chest pain.   DVT Prophylaxis-   Lovenox   AM Labs Ordered, also please review Full Orders  Family Communication: Admission, patients condition and plan of care including tests being ordered have been discussed with the patient and her daughter and daughter in law at bedside* who indicate understanding and agree with the plan and Code Status.  Code Status:  Full code  Admission status: Inpatient   Time spent in minutes : 60 min   Juliona Vales S M.D on 11/20/2015 at 4:51 AM  Between 7am to 7pm - Pager - [phone]. After 7pm go to [email or web address] - password [facility]  [facility] - Office  [phone]

## 2015-11-20 NOTE — Consult Note (Signed)
Referring Provider: Dr. Allyson Sabal  Primary Care Physician:  [name], MD Primary Gastroenterologist:  Dr. Gala Romney   Date of Admission: 11/20/15 Date of Consultation: 11/20/15  Reason for Consultation:  Jaundice, Elevated LFTs  HPI:  Sophia Ramirez is an 82 y.o. year old female who presented to the ED after being found on the floor. She tells me that she was checking the air conditioning and fell. She is a poor historian but very pleasant. She tells me it is 2017 and she is in [facility]. No one is in the room with her at time of consultation. She denies any pain, nausea, or vomiting. A nurse tech is present in the room about to feed her breakfast. I remind her that she is scheduled for a CT, and she takes the breakfast tray away. Patient states she has a good appetite. Denies any bowel habits changes. No chest pain, shortness of breath. No history of ETOH abuse or liver problems. Bilirubin markedly elevated at 24-27 range. CT abd/pelvis ordered for today. Patient denies any changes in skin color or urinary changes. No family at bedside.   Past Medical History  Diagnosis Date  . Hypertension   . Arthritis     Past Surgical History  Procedure Laterality Date  . Shoulder surgery    . Cholecystectomy    . Abdominal hysterectomy      Prior to Admission medications   Medication Sig Start Date End Date Taking? Authorizing Provider  acetaminophen (TYLENOL) 500 MG tablet Take 500 mg by mouth every 6 (six) hours as needed for pain.   Yes Historical Provider, MD  ALPRAZolam (XANAX) 0.25 MG tablet Take 0.25 mg by mouth 3 (three) times daily.   Yes Historical Provider, MD  ramipril (ALTACE) 2.5 MG tablet Take 2.5 mg by mouth daily.   Yes Historical Provider, MD  VERAPAMIL HCL ER, CO, PO Take 240 mg by mouth daily.   Yes Historical Provider, MD    Current Facility-Administered Medications  Medication Dose Route Frequency Provider Last Rate Last Dose  . 0.9 % NaCl with KCl 40 mEq / L   infusion   Intravenous Continuous Reyne Dumas, MD 100 mL/hr at 11/20/15 1056 100 mL/hr at 11/20/15 1056  . ALPRAZolam Duanne Moron) tablet 0.25 mg  0.25 mg Oral TID Oswald Hillock, MD   0.25 mg at 11/20/15 0950  . diatrizoate meglumine-sodium (GASTROGRAFIN) 66-10 % solution           . enoxaparin (LOVENOX) injection 40 mg  40 mg Subcutaneous Q24H Oswald Hillock, MD   40 mg at 11/20/15 0951  . ramipril (ALTACE) capsule 2.5 mg  2.5 mg Oral Daily Oswald Hillock, MD   2.5 mg at 11/20/15 0950  . sodium chloride flush (NS) 0.9 % injection 3 mL  3 mL Intravenous Q12H Oswald Hillock, MD   3 mL at 11/20/15 1000  . verapamil (CALAN-SR) CR tablet 240 mg  240 mg Oral Daily Oswald Hillock, MD   240 mg at 11/20/15 0950    Allergies as of 11/20/2015  . (No Known Allergies)    Family History  Problem Relation Age of Onset  . Colon cancer      unknown    Social History   Social History  . Marital Status: Widowed    Spouse Name: N/A  . Number of Children: N/A  . Years of Education: N/A   Occupational History  . Not on file.   Social History Main  Topics  . Smoking status: Never Smoker   . Smokeless tobacco: Not on file  . Alcohol Use: No  . Drug Use: No  . Sexual Activity: Not on file   Other Topics Concern  . Not on file   Social History Narrative    Review of Systems: As mentioned in HPI.   Physical Exam: Vital signs in last 24 hours: Temp:  [97.8 F (36.6 C)-98 F (36.7 C)] 98 F (36.7 C) (07/03 0500) Pulse Rate:  [63-76] 66 (07/03 0500) Resp:  [16-27] 20 (07/03 0500) BP: (122-159)/(53-79) 156/58 mmHg (07/03 0500) SpO2:  [79 %-100 %] 100 % (07/03 0500) Weight:  [138 lb (62.596 kg)] 138 lb (62.596 kg) (07/03 0114)   General:   Alert, markedly jaundiced.  Head:  Normocephalic and atraumatic. Eyes:  Scleral icterus  Ears:  Hard of hearing  Nose:  No deformity, discharge,  or lesions. Mouth:  No lesions Lungs:  Clear throughout to auscultation.  Heart:  Regular rate and rhythm Abdomen:   Rounded but soft, no rebound or guarding, no obvious mass  Rectal:  Deferred  Msk:  Moderate kyphosis  Extremities:  Without  edema. Neurologic:  Alert and  oriented to person and place  Skin:  Intact without significant lesions or rashes. Psych:  Alert and cooperative.   Intake/Output from previous day:   Intake/Output this shift:    Lab Results:  Recent Labs  11/20/15 0110 11/20/15 0548  WBC 10.2 9.2  HGB 11.1* 10.1*  HCT 30.9* 28.3*  PLT 447* 399   BMET  Recent Labs  11/20/15 0110 11/20/15 0548 11/20/15 0905  NA 122* 125* 123*  K 3.4* 3.1* 3.2*  CL 82* 85* 85*  CO2 25 27 26   GLUCOSE 155* 109* 112*  BUN 20 17 15   CREATININE <0.30* <0.30* <0.30*  CALCIUM 8.4* 8.0* 7.9*   LFT  Recent Labs  11/20/15 0110 11/20/15 0548  PROT 7.4 6.6  ALBUMIN 2.9* 2.5*  AST 245* 212*  ALT 172* 153*  ALKPHOS 454* 406*  BILITOT 27.7* 24.2*  BILIDIR  --  15.0*   PT/INR  Recent Labs  11/20/15 0110  LABPROT 15.4*  INR 1.20    Studies/Results: Dg Chest 1 View  11/20/2015  CLINICAL DATA:  Patient found on floor.  Initial encounter. EXAM: CHEST 1 VIEW COMPARISON:  Chest radiograph performed 07/12/2006 FINDINGS: The lungs are well-aerated and clear. There is no evidence of focal opacification, pleural effusion or pneumothorax. The cardiomediastinal silhouette is borderline normal in size. No acute osseous abnormalities are seen. Mild degenerative change is noted at the left glenohumeral joint. IMPRESSION: No acute cardiopulmonary process seen. Electronically Signed   By: Garald Balding M.D.   On: 11/20/2015 02:46   Ct Head Wo Contrast  11/20/2015  CLINICAL DATA:  Patient found on floor. Concern for head or cervical spine injury. Initial encounter. EXAM: CT HEAD WITHOUT CONTRAST CT CERVICAL SPINE WITHOUT CONTRAST TECHNIQUE: Multidetector CT imaging of the head and cervical spine was performed following the standard protocol without intravenous contrast. Multiplanar CT image  reconstructions of the cervical spine were also generated. COMPARISON:  None. FINDINGS: CT HEAD FINDINGS There is no evidence of acute infarction, mass lesion, or intra- or extra-axial hemorrhage on CT. Prominence of the ventricles and sulci reflects mild cortical volume loss. Mild cerebellar atrophy is noted. Scattered periventricular and subcortical matter change likely reflects small vessel ischemic microangiopathy. The brainstem and fourth ventricle are within normal limits. The basal ganglia are unremarkable in appearance. The  cerebral hemispheres demonstrate grossly normal gray-white differentiation. No mass effect or midline shift is seen. There is no evidence of fracture; visualized osseous structures are unremarkable in appearance. The orbits are within normal limits. The paranasal sinuses and mastoid air cells are well-aerated. No significant soft tissue abnormalities are seen. CT CERVICAL SPINE FINDINGS There is no evidence of acute fracture or subluxation. Vertebral bodies demonstrate normal height. Mild grade 1 anterolisthesis is noted of C3 on C4 and of C4 on C5. Multilevel disc space narrowing is noted along the cervical spine, with scattered anterior and posterior disc osteophyte complexes. Prevertebral soft tissues are within normal limits. Facet disease is noted along the cervical spine. The thyroid gland is unremarkable in appearance. The visualized lung apices are clear. Scattered calcification is noted at the carotid bifurcations bilaterally. IMPRESSION: 1. No evidence of traumatic intracranial injury or fracture. 2. No evidence of acute fracture or subluxation along the cervical spine. 3. Mild cortical volume loss and scattered small vessel ischemic microangiopathy. 4. Mild degenerative change along the cervical spine. 5. Scattered calcification at the carotid bifurcations bilaterally. Carotid ultrasound would be helpful for further evaluation, when and as deemed clinically appropriate.  Electronically Signed   By: Garald Balding M.D.   On: 11/20/2015 02:54   Ct Cervical Spine Wo Contrast  11/20/2015  CLINICAL DATA:  Patient found on floor. Concern for head or cervical spine injury. Initial encounter. EXAM: CT HEAD WITHOUT CONTRAST CT CERVICAL SPINE WITHOUT CONTRAST TECHNIQUE: Multidetector CT imaging of the head and cervical spine was performed following the standard protocol without intravenous contrast. Multiplanar CT image reconstructions of the cervical spine were also generated. COMPARISON:  None. FINDINGS: CT HEAD FINDINGS There is no evidence of acute infarction, mass lesion, or intra- or extra-axial hemorrhage on CT. Prominence of the ventricles and sulci reflects mild cortical volume loss. Mild cerebellar atrophy is noted. Scattered periventricular and subcortical matter change likely reflects small vessel ischemic microangiopathy. The brainstem and fourth ventricle are within normal limits. The basal ganglia are unremarkable in appearance. The cerebral hemispheres demonstrate grossly normal gray-white differentiation. No mass effect or midline shift is seen. There is no evidence of fracture; visualized osseous structures are unremarkable in appearance. The orbits are within normal limits. The paranasal sinuses and mastoid air cells are well-aerated. No significant soft tissue abnormalities are seen. CT CERVICAL SPINE FINDINGS There is no evidence of acute fracture or subluxation. Vertebral bodies demonstrate normal height. Mild grade 1 anterolisthesis is noted of C3 on C4 and of C4 on C5. Multilevel disc space narrowing is noted along the cervical spine, with scattered anterior and posterior disc osteophyte complexes. Prevertebral soft tissues are within normal limits. Facet disease is noted along the cervical spine. The thyroid gland is unremarkable in appearance. The visualized lung apices are clear. Scattered calcification is noted at the carotid bifurcations bilaterally. IMPRESSION:  1. No evidence of traumatic intracranial injury or fracture. 2. No evidence of acute fracture or subluxation along the cervical spine. 3. Mild cortical volume loss and scattered small vessel ischemic microangiopathy. 4. Mild degenerative change along the cervical spine. 5. Scattered calcification at the carotid bifurcations bilaterally. Carotid ultrasound would be helpful for further evaluation, when and as deemed clinically appropriate. Electronically Signed   By: Garald Balding M.D.   On: 11/20/2015 02:54    Impression: 82 year old female presenting with painless jaundice, concerning for malignancy. Awaiting CT findings. Will place hold on Lovenox dosing for tomorrow (11/21/15) in case ERCP is needed. No obvious signs of  cholangitis at this point. Will closely monitor. Further recommendations after imaging completed.   Plan: Hold 11/21/15 Lovenox dosing Await CT findings Further recommendations to follow   Orvil Feil, ANP-BC [facility]     LOS: 0 days    11/20/2015, 1:16 PM

## 2015-11-20 NOTE — Progress Notes (Signed)
Patient seen and examined  82 y.o. female, With history of hypertension, status post cholecystectomy who was brought to the ED after patient was found on the floor. Patient stated that she was going to get up from the air conditioning off in her room and stripped out of the bed onto the floor.Lab work in the ED revealed hyponatremia, transaminitis and total bilirubin elevated 27.7    Assessment and plan   1. Hyponatremia- likely from dehydration/SIADH, continue normal saline at 100 mL per hour.  Check TSH, cortisol, CT of the chest to rule out pulmonary process 2. Elevated LFTs- patient has transaminitis with elevated AST, ALT, alkaline phosphatase, bilirubin elevated to 27.7. Check CT abdomen and pelvis to rule out underlying malignancy. Suspect obstructive jaundice likely secondary to underlying malignancy. Consult gastroenterology. If underlying malignancy found then consider palliative stent vs complete comfort care. We'll consult palliative care for goals of care, pending further workup 3. Hypertension- continue Altace , verapamil. 4. Elevated troponin- troponin is 0.04, total CK is 222. Will obtain serial troponin. Patient has no chest pain

## 2015-11-20 NOTE — ED Provider Notes (Signed)
TIME SEEN: 1:30 AM  CHIEF COMPLAINT: Fall  HPI: Pt is a 82 y.o. female with history of hypertension, arthritis who presents to the emergency department with reported fall out of bed that occurred sometime between 12:30 AM and 5:40 PM on 11/20/15.  Daughters provide most of the history as patient is a poor historian.  They state that they found patient on the floor next to her bed this evening. It is unclear when she fell. Patient states that she was trying to get up to turn the air conditioning off in her room and is slipped out of the bed onto the floor. Unclear if she hit her head. She is not on anticoagulation. She is not able to tell me when she fell. Denies neck or back pain. No chest pain or abdominal pain. Denies numbness or focal weakness. Family reports they were able to get her off the floor and set her back in the bed but she has not ambulated. She does have a cane and walker at home but does not use them. File a report that she lives alone but they check on her every day. They still are normally she is very "sharp" but they feel that she is slightly altered today. They deny that she's had any recent fevers, cough, vomiting or diarrhea. No other known falls.  Patient does appear very jaundiced. Family reports that they feel this has gotten worse over the past 2 days. She denies any vomiting or diarrhea. Takes Tylenol intermittently but not regularly. Is status post hysterectomy and cholecystectomy. No history of hepatitis. No history of alcohol abuse.  ROS: See HPI Constitutional: no fever  Eyes: no drainage  ENT: no runny nose   Cardiovascular:  no chest pain  Resp: no SOB  GI: no vomiting GU: no dysuria Integumentary: no rash  Allergy: no hives  Musculoskeletal: no leg swelling  Neurological: no slurred speech ROS otherwise negative  PAST MEDICAL HISTORY/PAST SURGICAL HISTORY:  Past Medical History  Diagnosis Date  . Hypertension   . Arthritis     MEDICATIONS:  Prior to  Admission medications   Medication Sig Start Date End Date Taking? Authorizing Provider  acetaminophen (TYLENOL) 500 MG tablet Take 500 mg by mouth every 6 (six) hours as needed for pain.    Historical Provider, MD  ALPRAZolam Duanne Moron) 0.25 MG tablet Take 0.25 mg by mouth 3 (three) times daily.    Historical Provider, MD  ramipril (ALTACE) 2.5 MG tablet Take 2.5 mg by mouth daily.    Historical Provider, MD  traMADol (ULTRAM) 50 MG tablet Take 1 tablet (50 mg total) by mouth every 6 (six) hours as needed for pain. [id number]   Delora Fuel, MD  VERAPAMIL HCL ER, CO, PO Take 240 mg by mouth daily.    Historical Provider, MD    ALLERGIES:  No Known Allergies  SOCIAL HISTORY:  Social History  Substance Use Topics  . Smoking status: Never Smoker   . Smokeless tobacco: Not on file  . Alcohol Use: No    FAMILY HISTORY: History reviewed. No pertinent family history.  EXAM: BP 143/61 mmHg  Pulse 73  Temp(Src) 97.8 F (36.6 C) (Oral)  Resp 26  Ht 5\' 5"  (1.651 m)  Wt 138 lb (62.596 kg)  BMI 22.96 kg/m2  SpO2 99% CONSTITUTIONAL: Alert and oriented 3 and responds appropriately to questions. Elderly, smiling, in no distress, afebrile HEAD: Normocephalic; atraumatic EYES: Scleral icterus bilaterally, PERRL, EOMI ENT: normal nose; no rhinorrhea; moist mucous membranes; pharynx  without lesions noted; no septal hematoma NECK: Supple, no meningismus, no LAD; no midline spinal tenderness, step-off or deformity CARD: RRR; S1 and S2 appreciated; no murmurs, no clicks, no rubs, no gallops RESP: Normal chest excursion without splinting or tachypnea; breath sounds clear and equal bilaterally; no wheezes, no rhonchi, no rales; no hypoxia or respiratory distress CHEST:  chest wall stable, no crepitus or ecchymosis or deformity, nontender to palpation ABD/GI: Normal bowel sounds; non-distended; soft, non-tender, no rebound, no guarding PELVIS:  stable, nontender to palpation, able to lift both legs off  the bed without any pain, no leg length discrepancy BACK:  The back appears normal and is non-tender to palpation, there is no CVA tenderness; no midline spinal tenderness, step-off or deformity EXT: Normal ROM in all joints; non-tender to palpation; no edema; normal capillary refill; no cyanosis, no bony tenderness or bony deformity of patient's extremities, no joint effusion, no ecchymosis or lacerations    SKIN: Normal color for age and race; warm NEURO: Moves all extremities equally, sensation to light touch intact diffusely, cranial nerves II through XII intact PSYCH: The patient's mood and manner are appropriate. Grooming and personal hygiene are appropriate.  MEDICAL DECISION MAKING: Patient here with fall out of bed. Unclear how long patient was down. We'll obtain CT of her head, cervical spine, chest x-ray. We'll obtain labs, urine, CK. Patient also extremely jaundiced in the emergency department. This does not appear to be an issue that the daughters brought but an issue that the nursing staff brought up with the family upon seeing the patient. They state that they noticed that she was jaundiced 2 days ago. She denies abdominal pain. Does not take Tylenol regularly, and drink alcohol regularly and does not have a history of hepatitis. She is status post cholecystectomy. Abdominal exam is completely benign. We'll obtain abdominal labs, ammonia, coags, Tylenol level, hepatitis panel.  ED PROGRESS: 3:30 AM  Pt's sodium is 122. We'll gently hydrate patient. Her liver function tests are extremely elevated. Normal lipase. Normal coags and platelets. Concern for possible cancer. Abdominal exam is still completely benign. Ammonia is negative. Tylenol and ethanol level are negative. CT of the head, cervical spine negative for acute injury. Chest x-ray clear. Discussed with Dr. Darrick Meigs with hospitalist service for admission to medical bed, inpatient. Patient will need an ultrasound of her liver. I'll place  holding orders per his request. Care transferred to hospital service. Patient and family updated with plan.    I reviewed all nursing notes, vitals, pertinent old records, EKGs, labs, imaging (as available).     EKG Interpretation  Date/Time:  Monday November 20 2015 01:47:14 EDT Ventricular Rate:  71 PR Interval:    QRS Duration: 94 QT Interval:  395 QTC Calculation: 430 R Axis:   32 Text Interpretation:  Normal sinus rhythm Anterior infarct, old Artifact in lead(s) I II aVR aVL aVF No old tracing to compare Confirmed by WARD,  DO, KRISTEN ([id number]) on 11/20/2015 1:49:25 AM        CRITICAL CARE Performed by: Nyra Jabs   Total critical care time: 35 minutes  Critical care time was exclusive of separately billable procedures and treating other patients.  Critical care was necessary to treat or prevent imminent or life-threatening deterioration.  Critical care was time spent personally by me on the following activities: development of treatment plan with patient and/or surrogate as well as nursing, discussions with consultants, evaluation of patient's response to treatment, examination of patient, obtaining history from  patient or surrogate, ordering and performing treatments and interventions, ordering and review of laboratory studies, ordering and review of radiographic studies, pulse oximetry and re-evaluation of patient's condition.    [place], DO 11/20/15 [phone]

## 2015-11-20 NOTE — ED Notes (Signed)
Daughter states she left patients house last night 11/19/15 at midnight. Daughter returned to patients house 1740 11/20/15 and patient was in the floor. No obvious injury and no complaints from patient. Patient appears jaundice upon arrival and according to family, this is not normal. No complaints from patient.

## 2015-11-21 ENCOUNTER — Inpatient Hospital Stay (HOSPITAL_COMMUNITY): Payer: Medicare Other | Admitting: Anesthesiology

## 2015-11-21 ENCOUNTER — Encounter (HOSPITAL_COMMUNITY)
Admission: EM | Disposition: A | Discharge status: Skilled Nursing Facility | Payer: Self-pay | Source: Home / Self Care | Attending: Internal Medicine

## 2015-11-21 ENCOUNTER — Encounter (HOSPITAL_COMMUNITY): Payer: Self-pay | Admitting: Primary Care

## 2015-11-21 ENCOUNTER — Inpatient Hospital Stay (HOSPITAL_COMMUNITY): Payer: Medicare Other

## 2015-11-21 DIAGNOSIS — Z515 Encounter for palliative care: Secondary | ICD-10-CM | POA: Insufficient documentation

## 2015-11-21 DIAGNOSIS — C250 Malignant neoplasm of head of pancreas: Secondary | ICD-10-CM

## 2015-11-21 DIAGNOSIS — C259 Malignant neoplasm of pancreas, unspecified: Secondary | ICD-10-CM | POA: Insufficient documentation

## 2015-11-21 DIAGNOSIS — Z7189 Other specified counseling: Secondary | ICD-10-CM | POA: Insufficient documentation

## 2015-11-21 DIAGNOSIS — K831 Obstruction of bile duct: Secondary | ICD-10-CM

## 2015-11-21 DIAGNOSIS — R17 Unspecified jaundice: Secondary | ICD-10-CM

## 2015-11-21 DIAGNOSIS — W19XXXA Unspecified fall, initial encounter: Secondary | ICD-10-CM | POA: Insufficient documentation

## 2015-11-21 DIAGNOSIS — K222 Esophageal obstruction: Secondary | ICD-10-CM

## 2015-11-21 DIAGNOSIS — D490 Neoplasm of unspecified behavior of digestive system: Secondary | ICD-10-CM

## 2015-11-21 HISTORY — PX: BILIARY STENT PLACEMENT: SHX5538

## 2015-11-21 HISTORY — PX: ERCP: SHX5425

## 2015-11-21 HISTORY — PX: ESOPHAGOGASTRODUODENOSCOPY (EGD) WITH PROPOFOL: SHX5813

## 2015-11-21 HISTORY — PX: SPHINCTEROTOMY: SHX5544

## 2015-11-21 LAB — COMPREHENSIVE METABOLIC PANEL
ALK PHOS: 401 U/L — AB (ref 38–126)
ALT: 147 U/L — AB (ref 14–54)
ANION GAP: 9 (ref 5–15)
AST: 212 U/L — ABNORMAL HIGH (ref 15–41)
Albumin: 2.2 g/dL — ABNORMAL LOW (ref 3.5–5.0)
BUN: 11 mg/dL (ref 6–20)
CALCIUM: 7.4 mg/dL — AB (ref 8.9–10.3)
CO2: 26 mmol/L (ref 22–32)
Chloride: 90 mmol/L — ABNORMAL LOW (ref 101–111)
Glucose, Bld: 94 mg/dL (ref 65–99)
Potassium: 3.3 mmol/L — ABNORMAL LOW (ref 3.5–5.1)
Sodium: 125 mmol/L — ABNORMAL LOW (ref 135–145)
Total Bilirubin: 23.7 mg/dL (ref 0.3–1.2)
Total Protein: 6.1 g/dL — ABNORMAL LOW (ref 6.5–8.1)

## 2015-11-21 LAB — CBC
HCT: 27.4 % — ABNORMAL LOW (ref 36.0–46.0)
Hemoglobin: 9.7 g/dL — ABNORMAL LOW (ref 12.0–15.0)
MCH: 28.4 pg (ref 26.0–34.0)
MCHC: 35.4 g/dL (ref 30.0–36.0)
MCV: 80.1 fL (ref 78.0–100.0)
PLATELETS: 426 10*3/uL — AB (ref 150–400)
RBC: 3.42 MIL/uL — AB (ref 3.87–5.11)
RDW: 18 % — ABNORMAL HIGH (ref 11.5–15.5)
WBC: 9 10*3/uL (ref 4.0–10.5)

## 2015-11-21 SURGERY — ERCP, WITH INTERVENTION IF INDICATED
Anesthesia: General

## 2015-11-21 MED ORDER — PROPOFOL 10 MG/ML IV BOLUS
INTRAVENOUS | Status: DC | PRN
  Administered 2015-11-21: 90 mg via INTRAVENOUS

## 2015-11-21 MED ORDER — SUCCINYLCHOLINE CHLORIDE 20 MG/ML IJ SOLN
INTRAMUSCULAR | Status: DC | PRN
  Administered 2015-11-21: 120 mg via INTRAVENOUS

## 2015-11-21 MED ORDER — GLYCOPYRROLATE 0.2 MG/ML IJ SOLN
INTRAMUSCULAR | Status: AC
  Filled 2015-11-21: qty 1

## 2015-11-21 MED ORDER — IOPAMIDOL (ISOVUE-300) INJECTION 61%
INTRAVENOUS | Status: AC
  Filled 2015-11-21: qty 50

## 2015-11-21 MED ORDER — LIDOCAINE HCL 1 % IJ SOLN
INTRAMUSCULAR | Status: DC | PRN
  Administered 2015-11-21: 20 mg via INTRADERMAL

## 2015-11-21 MED ORDER — ACETAMINOPHEN 325 MG PO TABS: 325.0000 mg | ORAL_TABLET | Freq: Four times a day (QID) | ORAL | Status: DC | PRN

## 2015-11-21 MED ORDER — SODIUM CHLORIDE 0.9 % IV SOLN
INTRAVENOUS | Status: DC
  Administered 2015-11-21: 1000 mL via INTRAVENOUS

## 2015-11-21 MED ORDER — PANTOPRAZOLE SODIUM 40 MG PO TBEC
40.0000 mg | DELAYED_RELEASE_TABLET | Freq: Every day | ORAL | Status: DC
  Administered 2015-11-22 – 2015-11-26 (×5): 40 mg via ORAL
  Filled 2015-11-21 (×5): qty 1

## 2015-11-21 MED ORDER — LABETALOL HCL 5 MG/ML IV SOLN
INTRAVENOUS | Status: DC | PRN
  Administered 2015-11-21: 10 mg via INTRAVENOUS

## 2015-11-21 MED ORDER — SODIUM CHLORIDE 0.9 % IV SOLN
INTRAVENOUS | Status: DC | PRN
  Administered 2015-11-21: 50 mL

## 2015-11-21 MED ORDER — PROPOFOL 10 MG/ML IV BOLUS
INTRAVENOUS | Status: AC
  Filled 2015-11-21: qty 20

## 2015-11-21 MED ORDER — LACTATED RINGERS IV SOLN
INTRAVENOUS | Status: DC
  Administered 2015-11-21: 1000 mL via INTRAVENOUS

## 2015-11-21 MED ORDER — MIDAZOLAM HCL 2 MG/2ML IJ SOLN
INTRAMUSCULAR | Status: AC
  Filled 2015-11-21: qty 2

## 2015-11-21 MED ORDER — ONDANSETRON HCL 4 MG/2ML IJ SOLN
INTRAMUSCULAR | Status: AC
  Filled 2015-11-21: qty 2

## 2015-11-21 MED ORDER — STERILE WATER FOR IRRIGATION IR SOLN
Status: DC | PRN
  Administered 2015-11-21: 5 mL

## 2015-11-21 MED ORDER — FENTANYL CITRATE (PF) 100 MCG/2ML IJ SOLN
INTRAMUSCULAR | Status: AC
  Filled 2015-11-21: qty 2

## 2015-11-21 MED ORDER — CALCIUM GLUCONATE 10 % IV SOLN
INTRAVENOUS | Status: DC | PRN
  Administered 2015-11-21: 2.5 mg via INTRAVENOUS

## 2015-11-21 MED ORDER — ONDANSETRON HCL 4 MG/2ML IJ SOLN
INTRAMUSCULAR | Status: DC | PRN
  Administered 2015-11-21: 4 mg via INTRAVENOUS

## 2015-11-21 MED ORDER — GLYCOPYRROLATE 0.2 MG/ML IJ SOLN
INTRAMUSCULAR | Status: DC | PRN
  Administered 2015-11-21: 0.2 mg via INTRAVENOUS

## 2015-11-21 MED ORDER — IOPAMIDOL (ISOVUE-300) INJECTION 61%
INTRAVENOUS | Status: AC
  Filled 2015-11-21: qty 100

## 2015-11-21 MED ORDER — STERILE WATER FOR IRRIGATION IR SOLN
Status: DC | PRN
  Administered 2015-11-21: 1000 mL

## 2015-11-21 MED ORDER — LABETALOL HCL 5 MG/ML IV SOLN
INTRAVENOUS | Status: AC
  Filled 2015-11-21: qty 4

## 2015-11-21 MED ORDER — SODIUM CHLORIDE 0.9 % IV SOLN
INTRAVENOUS | Status: DC | PRN
  Administered 2015-11-21: 15:00:00 via INTRAVENOUS

## 2015-11-21 MED ORDER — GLUCAGON HCL RDNA (DIAGNOSTIC) 1 MG IJ SOLR
INTRAMUSCULAR | Status: AC
  Administered 2015-11-21: 0.25 mg via INTRAVENOUS
  Filled 2015-11-21: qty 2

## 2015-11-21 MED ORDER — MIDAZOLAM HCL 5 MG/5ML IJ SOLN
INTRAMUSCULAR | Status: DC | PRN
  Administered 2015-11-21 (×2): 0.5 mg via INTRAVENOUS

## 2015-11-21 NOTE — Progress Notes (Signed)
Patient has no complaints. Lab studies reviewed. Patient's condition discussed with several family members. Patient has decided to proceed with ERCP with biliary stenting. She is suspected to have malignant biliary obstruction. Hyponatremia is stable and possibly due to SIADH. She has a mildly elevated troponin levels felt to be insignificant.  Will proceed with ERCP with biliary stenting.

## 2015-11-21 NOTE — Anesthesia Preprocedure Evaluation (Addendum)
Anesthesia Evaluation  Patient identified by MRN, date of birth, ID band Patient awake    Reviewed: Allergy & Precautions, NPO status , Patient's Chart, lab work & pertinent test results, reviewed documented beta blocker date and time   Airway Mallampati: II  TM Distance: >3 FB Neck ROM: full    Dental  (+) Missing   Pulmonary  Cough at present non productive   + rhonchi        Cardiovascular Exercise Tolerance: Poor hypertension, Pt. on medications  Rhythm:regular Rate:Normal     Neuro/Psych Anxiety    GI/Hepatic GERD  Controlled,  Endo/Other    Renal/GU      Musculoskeletal  (+) Arthritis ,   Abdominal   Peds  Hematology   Anesthesia Other Findings Left  shoulder stiff but does move.Family stated pt fell at home past  Saturday but has no external,visible injuries seen.  Reproductive/Obstetrics                           Anesthesia Physical Anesthesia Plan  ASA: III and emergent  Anesthesia Plan: General and Rapid Sequence   Post-op Pain Management:    Induction: Rapid sequence and Intravenous  Airway Management Planned: Oral ETT  Additional Equipment:   Intra-op Plan:   Post-operative Plan: Extubation in OR  Informed Consent: I have reviewed the patients History and Physical, chart, labs and discussed the procedure including the risks, benefits and alternatives for the proposed anesthesia with the patient or authorized representative who has indicated his/her understanding and acceptance.     Plan Discussed with: Anesthesiologist  Anesthesia Plan Comments:        Anesthesia Quick Evaluation

## 2015-11-21 NOTE — Op Note (Signed)
[facility] Patient Name: Sophia Ramirez Procedure Date: 11/21/2015 3:00 PM MRN: [id number] Date of Birth: 1925-12-21 Attending MD: Hildred Laser , MD CSN: [id number] Age: 82 Admit Type: Inpatient Procedure:                ERCP Indications:              Pancreatic tumor on Computed Tomogram Scan, Jaundice Providers:                Hildred Laser, MD, Rosina Lowenstein, RN, Bonnetta Barry,                            Technician Referring MD:             Eleonore Chiquito, MD Medicines:                General Anesthesia Complications:            No immediate complications. Estimated Blood Loss:     Estimated blood loss: none. Procedure:                Pre-Anesthesia Assessment:                           - Prior to the procedure, a History and Physical                            was performed, and patient medications and                            allergies were reviewed. The patient's tolerance of                            previous anesthesia was also reviewed. The risks                            and benefits of the procedure and the sedation                            options and risks were discussed with the patient.                            All questions were answered, and informed consent                            was obtained. Prior Anticoagulants: The patient has                            taken no previous anticoagulant or antiplatelet                            agents. ASA Grade Assessment: III - A patient with                            severe systemic disease. After reviewing the risks  and benefits, the patient was deemed in                            satisfactory condition to undergo the procedure.                           After obtaining informed consent, the scope was                            passed under direct vision. Throughout the                            procedure, the patient's blood pressure, pulse, and                            oxygen  saturations were monitored continuously. The                            [id number] scope was introduced through                            the mouth, and used to inject contrast into and                            used to inject contrast into the bile duct. The                            ERCP was accomplished without difficulty. The                            patient tolerated the procedure well. Scope In: 3:36:39 PM Scope Out: 4:11:59 PM Total Procedure Duration: 0 hours 35 minutes 20 seconds  Findings:      The scout film was normal. The major papilla was normal. The minor       papilla was normal. The bile duct was deeply cannulated with the       Hydratome and glidewire. Contrast was injected. I personally interpreted       the bile duct images. The flow of contrast through the ducts was poor.       Image quality was excellent. Contrast extended to the main bile duct.       Contrast extended to the bifurcation. Contrast extended to the hepatic       ducts. The lower third of the main bile duct was completely obstructed       by what appeared to be a mass. The common bile duct and common hepatic       duct were diffusely dilated, with a mass causing an obstruction. The       largest diameter was over 25 mm. A 6 mm biliary sphincterotomy was made       with a braided Autotome sphincterotome using ERBE electrocautery. There       was no post-sphincterotomy bleeding. One 10 Fr by 6 cm covered metal       stent was placed 5 cm into the common bile duct. Clear fluid flowed       through the stent. The  stent was in good position. Impression:               - The major papilla appeared normal.                           - The minor papilla appeared normal.                           - A biliary tract obstruction secondary to what                            appeared to be a mass was found in the lower third                            of the main duct.                           - The  common bile duct and common hepatic duct were                            dilated, with a mass causing an obstruction.                           - A biliary sphincterotomy was performed.                           - One covered metal stent was placed into the                            common bile duct.                           - Benign-appearing esophageal stenosis at GE                            junction with chnges of esophagitis. Moderate Sedation:      Per Anesthesia Care      Per Anesthesia Care Recommendation:           - Avoid aspirin and nonsteroidal anti-inflammatory                            medicines for 3 days.                           - Return patient to hospital ward for ongoing care.                           - Check liver enzymes (AST, ALT, alkaline                            phosphatase, bilirubin) in the morning.                           - Clear liquid diet today.                           -  Use Protonix (pantoprazole) 40 mg PO daily today.                           - CA-19-9. Procedure Code(s):        --- Professional ---                           [phone], Endoscopic retrograde                            cholangiopancreatography (ERCP); with placement of                            endoscopic stent into biliary or pancreatic duct,                            including pre- and post-dilation and guide wire                            passage, when performed, including sphincterotomy,                            when performed, each stent Diagnosis Code(s):        --- Professional ---                           K83.1, Obstruction of bile duct                           K22.2, Esophageal obstruction                           D49.0, Neoplasm of unspecified behavior of                            digestive system                           R17, Unspecified jaundice CPT copyright 2016 [employer]. All rights reserved. The codes documented in this report are  preliminary and upon coder review may  be revised to meet current compliance requirements. Hildred Laser, MD Hildred Laser, MD 11/21/2015 4:54:20 PM This report has been signed electronically. Number of Addenda: 0

## 2015-11-21 NOTE — Anesthesia Procedure Notes (Signed)
Procedure Name: Intubation Date/Time: 11/21/2015 3:19 PM Performed by: Charmaine Downs Pre-anesthesia Checklist: Emergency Drugs available, Patient identified, Suction available and Patient being monitored Patient Re-evaluated:Patient Re-evaluated prior to inductionOxygen Delivery Method: Circle system utilized Preoxygenation: Pre-oxygenation with 100% oxygen Intubation Type: IV induction, Rapid sequence and Cricoid Pressure applied Laryngoscope Size: Mac and 3 Grade View: Grade I Number of attempts: 1 Airway Equipment and Method: Stylet Placement Confirmation: ETT inserted through vocal cords under direct vision,  positive ETCO2 and breath sounds checked- equal and bilateral Secured at: 22 cm Tube secured with: Tape Dental Injury: Teeth and Oropharynx as per pre-operative assessment

## 2015-11-21 NOTE — Transfer of Care (Signed)
Immediate Anesthesia Transfer of Care Note  Patient: Sophia Ramirez  Procedure(s) Performed: Procedure(s): ENDOSCOPIC RETROGRADE CHOLANGIOPANCREATOGRAPHY (ERCP) (N/A) SPHINCTEROTOMY (N/A) BILIARY STENT PLACEMENT (N/A) ESOPHAGOGASTRODUODENOSCOPY (EGD) WITH PROPOFOL (N/A)  Patient Location: PACU  Anesthesia Type:General  Level of Consciousness: sedated  Airway & Oxygen Therapy: Patient Spontanous Breathing and Patient connected to face mask oxygen  Post-op Assessment: Report given to RN, Post -op Vital signs reviewed and stable and Patient moving all extremities  Post vital signs: Reviewed and stable  Last Vitals:  Filed Vitals:   11/21/15 1417 11/21/15 1448  BP: 152/76 165/85  Pulse: 64 80  Temp: 36.9 C 37 C  Resp: 18     Last Pain:  Filed Vitals:   11/21/15 1454  PainSc: 0-No pain      Patients Stated Pain Goal: 5 ([phone])  Complications: No apparent anesthesia complications

## 2015-11-21 NOTE — Progress Notes (Signed)
[facility] PROGRESS NOTE  Sophia Ramirez [id number] DOB: 1926/01/16 DOA: 11/20/2015   PCP: [name], MD     Assessment/Plan: Active Problems:   Hyponatremia   Elevated LFTs   Fall   Hyperbilirubinemia    82 y.o. female, With history of hypertension, status post cholecystectomy who was brought to the ED after patient was found on the floor. Patient stated that she was going to get up from the air conditioning off in her room and stripped out of the bed onto the floor.Lab work in the ED revealed hyponatremia, transaminitis and total bilirubin elevated 27.7    Assessment and plan  1. Hyponatremia- likely from dehydration/SIADH due to lung mets , continue normal saline at 100 mL per hour.  TSH nl , cortisol 27.6 , CT of the chest shows spiculated mass in the right lower lobe 2. Obstructive jaundice/pancreatic cancer- patient has transaminitis with elevated AST, ALT, alkaline phosphatase, bilirubin elevated to  23.7. Check CT abdomen and pelvis to rule out underlying malignancy. Suspect obstructive jaundice likely secondary to underlying malignancy. Consulted gastroenterology. Anticipated to have ERCP with stent placement today. We'll consult palliative care for goals of care, pending further workup 3. Hypertension- continue Altace , verapamil. 4. Elevated troponin- troponin is 0.04, total CK is 222.  5. Fever-suspect tumor fever , white count normal, doubt cholangitis   DVT prophylaxsis Lovenox on hold for procedure  Code Status:  Full code     Family Communication: Discussed in detail with the patient's daughter about the findings of the CAT scan, plan of care, prognosis, all imaging results, lab results explained to the patient   Disposition Plan:  Not ready for discharge for the next 3-5 days ending further decisions     Consultants:  Gastroenterology  Palliative care  Procedures:  None  Antibiotics: Anti-infectives    None          HPI/Subjective: Patient somewhat confused, low-grade fever, denies nausea vomiting abdominal pain  Objective: Filed Vitals:   11/20/15 0500 11/20/15 1339 11/20/15 2255 11/21/15 0620  BP: 156/58 159/76 158/67 154/67  Pulse: 66 79 67 72  Temp: 98 F (36.7 C) 99 F (37.2 C) 99.2 F (37.3 C) 98.9 F (37.2 C)  TempSrc: Oral Oral Oral Oral  Resp: 20 20 20 20   Height:      Weight:      SpO2: 100% 96% 98% 97%    Intake/Output Summary (Last 24 hours) at 11/21/15 0816 Last data filed at 11/21/15 0500  Gross per 24 hour  Intake   1240 ml  Output      4 ml  Net   1236 ml    Exam:  Examination:  General exam: Appears calm and comfortable  Respiratory system: Clear to auscultation. Respiratory effort normal. Cardiovascular system: S1 & S2 heard, RRR. No JVD, murmurs, rubs, gallops or clicks. No pedal edema. Gastrointestinal system: Abdomen is nondistended, soft and nontender. No organomegaly or masses felt. Normal bowel sounds heard. Central nervous system: Alert and oriented. No focal neurological deficits. Extremities: Symmetric 5 x 5 power. Skin: No rashes, lesions or ulcers Psychiatry: Judgement and insight appear normal. Mood & affect appropriate.     Data Reviewed: I have personally reviewed following labs and imaging studies  Micro Results No results found for this or any previous visit (from the past 240 hour(s)).  Radiology Reports Dg Chest 1 View  11/20/2015  CLINICAL DATA:  Patient found on floor.  Initial encounter.  EXAM: CHEST 1 VIEW COMPARISON:  Chest radiograph performed 07/12/2006 FINDINGS: The lungs are well-aerated and clear. There is no evidence of focal opacification, pleural effusion or pneumothorax. The cardiomediastinal silhouette is borderline normal in size. No acute osseous abnormalities are seen. Mild degenerative change is noted at the left glenohumeral joint. IMPRESSION: No acute cardiopulmonary process seen. Electronically Signed   By:  Garald Balding M.D.   On: 11/20/2015 02:46   Ct Head Wo Contrast  11/20/2015  CLINICAL DATA:  Patient found on floor. Concern for head or cervical spine injury. Initial encounter. EXAM: CT HEAD WITHOUT CONTRAST CT CERVICAL SPINE WITHOUT CONTRAST TECHNIQUE: Multidetector CT imaging of the head and cervical spine was performed following the standard protocol without intravenous contrast. Multiplanar CT image reconstructions of the cervical spine were also generated. COMPARISON:  None. FINDINGS: CT HEAD FINDINGS There is no evidence of acute infarction, mass lesion, or intra- or extra-axial hemorrhage on CT. Prominence of the ventricles and sulci reflects mild cortical volume loss. Mild cerebellar atrophy is noted. Scattered periventricular and subcortical matter change likely reflects small vessel ischemic microangiopathy. The brainstem and fourth ventricle are within normal limits. The basal ganglia are unremarkable in appearance. The cerebral hemispheres demonstrate grossly normal gray-white differentiation. No mass effect or midline shift is seen. There is no evidence of fracture; visualized osseous structures are unremarkable in appearance. The orbits are within normal limits. The paranasal sinuses and mastoid air cells are well-aerated. No significant soft tissue abnormalities are seen. CT CERVICAL SPINE FINDINGS There is no evidence of acute fracture or subluxation. Vertebral bodies demonstrate normal height. Mild grade 1 anterolisthesis is noted of C3 on C4 and of C4 on C5. Multilevel disc space narrowing is noted along the cervical spine, with scattered anterior and posterior disc osteophyte complexes. Prevertebral soft tissues are within normal limits. Facet disease is noted along the cervical spine. The thyroid gland is unremarkable in appearance. The visualized lung apices are clear. Scattered calcification is noted at the carotid bifurcations bilaterally. IMPRESSION: 1. No evidence of traumatic  intracranial injury or fracture. 2. No evidence of acute fracture or subluxation along the cervical spine. 3. Mild cortical volume loss and scattered small vessel ischemic microangiopathy. 4. Mild degenerative change along the cervical spine. 5. Scattered calcification at the carotid bifurcations bilaterally. Carotid ultrasound would be helpful for further evaluation, when and as deemed clinically appropriate. Electronically Signed   By: Garald Balding M.D.   On: 11/20/2015 02:54   Ct Chest W Contrast  11/20/2015  CLINICAL DATA:  Elevated LFTs, transaminitis, jaundice, total bilirubin =24.2, history hypertension EXAM: CT CHEST, ABDOMEN, AND PELVIS WITH CONTRAST TECHNIQUE: Multidetector CT imaging of the chest, abdomen and pelvis was performed following the standard protocol during bolus administration of intravenous contrast. Sagittal and coronal MPR images reconstructed from axial data set. CONTRAST:  191mL ISOVUE-300 IOPAMIDOL (ISOVUE-300) INJECTION 61% IV. Dilute oral contrast. COMPARISON:  None FINDINGS: CT CHEST FINDINGS Cardiovascular: Significant atherosclerotic calcification aorta, coronary arteries and great vessels. Large dense calcified plaque at innominate artery. No aortic aneurysm or dissection. Pulmonary arteries grossly patent on nondedicated exam. Mediastinum/Lymph Nodes: Tracheobronchial cartilage calcification extending into hila. No thoracic adenopathy. Esophagus unremarkable. Lungs/Pleura: Lobulated mass with slightly spiculated margins in RIGHT lower lobe, partially chronic center, overall 2.2 x 2.2 x 2.4 cm compatible with tumor, question primary versus metastatic. Additional nodule with slightly ill-defined margins 7 mm diameter anterior RIGHT lower lobe image 54. Questionable subpleural nodule lingula 4 mm diameter image 57. Minimal bibasilar atelectasis. No  infiltrate, pleural effusion, or pneumothorax. Musculoskeletal: Calcified subcoracoid loose body RIGHT shoulder with RIGHT  glenohumeral degenerative changes. Advanced glenohumeral degenerative changes with spurring and calcific debris at LEFT glenohumeral joint. Diffuse osseous demineralization. No destructive bone lesions. CT ABDOMEN PELVIS FINDINGS Hepatobiliary: Marked intrahepatic and extrahepatic biliary dilatation. CBD 3.0 cm transverse. Poorly defined low-attenuation lesion at medial RIGHT lobe liver 14 x 12 mm question metastasis. Pancreas: Poorly defined low-attenuation mass at head and uncinate process of pancreas, 3.1 x 3.3 x 3.1 cm highly suspicious for a primary pancreatic neoplasm. Marked atrophy of the pancreatic body and tail with significant pancreatic ductal dilatation up to 11 mm diameter. Mass abuts the SMA and SMV as well as anterior margin of the aorta and third portion of duodenum. Spleen: Normal appearance Adrenals/Urinary Tract: Exophytic cyst lateral RIGHT kidney 2.0 x 1.4 cm image 71. Kidneys and adrenal glands otherwise normal appearance. No hydronephrosis or hydroureter. No urinary tract calcification. Bladder well distended and unremarkable. Stomach/Bowel: Descending duodenum displaced laterally by dilated CBD. Stomach and bowel loops otherwise grossly normal appearance Vascular/Lymphatic: Extensive atherosclerotic calcifications aorta and abdominal branch vessels. Reproductive: Uterus surgically absent with nonvisualization of ovaries Other: No free air or free fluid Musculoskeletal: Degenerative disc disease changes greatest at L3-L41. No destructive bone lesions. IMPRESSION: Mass at head and uncinate process of pancreas 3.1 x 3.3 x 3.1 cm highly suspicious for a primary pancreatic neoplasm with associated marked intrahepatic and extrahepatic biliary dilatation. Suspected RIGHT lower lobe and hepatic metastases as above. Aortic atherosclerosis and coronary arterial atherosclerotic cyst. Findings discussed with Dr. Jena Gaussourk prior to dictation of this report on 11/20/2015. Electronically Signed   By: Ulyses SouthwardMark   Boles M.D.   On: 11/20/2015 13:21   Ct Cervical Spine Wo Contrast  11/20/2015  CLINICAL DATA:  Patient found on floor. Concern for head or cervical spine injury. Initial encounter. EXAM: CT HEAD WITHOUT CONTRAST CT CERVICAL SPINE WITHOUT CONTRAST TECHNIQUE: Multidetector CT imaging of the head and cervical spine was performed following the standard protocol without intravenous contrast. Multiplanar CT image reconstructions of the cervical spine were also generated. COMPARISON:  None. FINDINGS: CT HEAD FINDINGS There is no evidence of acute infarction, mass lesion, or intra- or extra-axial hemorrhage on CT. Prominence of the ventricles and sulci reflects mild cortical volume loss. Mild cerebellar atrophy is noted. Scattered periventricular and subcortical matter change likely reflects small vessel ischemic microangiopathy. The brainstem and fourth ventricle are within normal limits. The basal ganglia are unremarkable in appearance. The cerebral hemispheres demonstrate grossly normal gray-white differentiation. No mass effect or midline shift is seen. There is no evidence of fracture; visualized osseous structures are unremarkable in appearance. The orbits are within normal limits. The paranasal sinuses and mastoid air cells are well-aerated. No significant soft tissue abnormalities are seen. CT CERVICAL SPINE FINDINGS There is no evidence of acute fracture or subluxation. Vertebral bodies demonstrate normal height. Mild grade 1 anterolisthesis is noted of C3 on C4 and of C4 on C5. Multilevel disc space narrowing is noted along the cervical spine, with scattered anterior and posterior disc osteophyte complexes. Prevertebral soft tissues are within normal limits. Facet disease is noted along the cervical spine. The thyroid gland is unremarkable in appearance. The visualized lung apices are clear. Scattered calcification is noted at the carotid bifurcations bilaterally. IMPRESSION: 1. No evidence of traumatic  intracranial injury or fracture. 2. No evidence of acute fracture or subluxation along the cervical spine. 3. Mild cortical volume loss and scattered small vessel ischemic microangiopathy. 4. Mild  degenerative change along the cervical spine. 5. Scattered calcification at the carotid bifurcations bilaterally. Carotid ultrasound would be helpful for further evaluation, when and as deemed clinically appropriate. Electronically Signed   By: Garald Balding M.D.   On: 11/20/2015 02:54   Ct Abdomen Pelvis W Contrast  11/20/2015  CLINICAL DATA:  Elevated LFTs, transaminitis, jaundice, total bilirubin =24.2, history hypertension EXAM: CT CHEST, ABDOMEN, AND PELVIS WITH CONTRAST TECHNIQUE: Multidetector CT imaging of the chest, abdomen and pelvis was performed following the standard protocol during bolus administration of intravenous contrast. Sagittal and coronal MPR images reconstructed from axial data set. CONTRAST:  116mL ISOVUE-300 IOPAMIDOL (ISOVUE-300) INJECTION 61% IV. Dilute oral contrast. COMPARISON:  None FINDINGS: CT CHEST FINDINGS Cardiovascular: Significant atherosclerotic calcification aorta, coronary arteries and great vessels. Large dense calcified plaque at innominate artery. No aortic aneurysm or dissection. Pulmonary arteries grossly patent on nondedicated exam. Mediastinum/Lymph Nodes: Tracheobronchial cartilage calcification extending into hila. No thoracic adenopathy. Esophagus unremarkable. Lungs/Pleura: Lobulated mass with slightly spiculated margins in RIGHT lower lobe, partially chronic center, overall 2.2 x 2.2 x 2.4 cm compatible with tumor, question primary versus metastatic. Additional nodule with slightly ill-defined margins 7 mm diameter anterior RIGHT lower lobe image 54. Questionable subpleural nodule lingula 4 mm diameter image 57. Minimal bibasilar atelectasis. No infiltrate, pleural effusion, or pneumothorax. Musculoskeletal: Calcified subcoracoid loose body RIGHT shoulder with RIGHT  glenohumeral degenerative changes. Advanced glenohumeral degenerative changes with spurring and calcific debris at LEFT glenohumeral joint. Diffuse osseous demineralization. No destructive bone lesions. CT ABDOMEN PELVIS FINDINGS Hepatobiliary: Marked intrahepatic and extrahepatic biliary dilatation. CBD 3.0 cm transverse. Poorly defined low-attenuation lesion at medial RIGHT lobe liver 14 x 12 mm question metastasis. Pancreas: Poorly defined low-attenuation mass at head and uncinate process of pancreas, 3.1 x 3.3 x 3.1 cm highly suspicious for a primary pancreatic neoplasm. Marked atrophy of the pancreatic body and tail with significant pancreatic ductal dilatation up to 11 mm diameter. Mass abuts the SMA and SMV as well as anterior margin of the aorta and third portion of duodenum. Spleen: Normal appearance Adrenals/Urinary Tract: Exophytic cyst lateral RIGHT kidney 2.0 x 1.4 cm image 71. Kidneys and adrenal glands otherwise normal appearance. No hydronephrosis or hydroureter. No urinary tract calcification. Bladder well distended and unremarkable. Stomach/Bowel: Descending duodenum displaced laterally by dilated CBD. Stomach and bowel loops otherwise grossly normal appearance Vascular/Lymphatic: Extensive atherosclerotic calcifications aorta and abdominal branch vessels. Reproductive: Uterus surgically absent with nonvisualization of ovaries Other: No free air or free fluid Musculoskeletal: Degenerative disc disease changes greatest at L3-L41. No destructive bone lesions. IMPRESSION: Mass at head and uncinate process of pancreas 3.1 x 3.3 x 3.1 cm highly suspicious for a primary pancreatic neoplasm with associated marked intrahepatic and extrahepatic biliary dilatation. Suspected RIGHT lower lobe and hepatic metastases as above. Aortic atherosclerosis and coronary arterial atherosclerotic cyst. Findings discussed with Dr. Gala Romney prior to dictation of this report on 11/20/2015. Electronically Signed   By: Lavonia Dana M.D.   On: 11/20/2015 13:21     CBC  Recent Labs Lab 11/20/15 0110 11/20/15 0548 11/21/15 0537  WBC 10.2 9.2 9.0  HGB 11.1* 10.1* 9.7*  HCT 30.9* 28.3* 27.4*  PLT 447* 399 426*  MCV 80.3 80.2 80.1  MCH 28.8 28.6 28.4  MCHC 35.9 35.7 35.4  RDW 17.4* 17.8* 18.0*  LYMPHSABS 1.0  --   --   MONOABS 0.8  --   --   EOSABS 0.0  --   --   BASOSABS 0.0  --   --  Chemistries   Recent Labs Lab 11/20/15 0110 11/20/15 0548 11/20/15 0905 11/21/15 0537  NA 122* 125* 123* 125*  K 3.4* 3.1* 3.2* 3.3*  CL 82* 85* 85* 90*  CO2 25 27 26 26   GLUCOSE 155* 109* 112* 94  BUN 20 17 15 11   CREATININE <0.30* <0.30* <0.30* <0.30*  CALCIUM 8.4* 8.0* 7.9* 7.4*  AST 245* 212*  --  212*  ALT 172* 153*  --  147*  ALKPHOS 454* 406*  --  401*  BILITOT 27.7* 24.2*  --  23.7*   ------------------------------------------------------------------------------------------------------------------ CrCl cannot be calculated (Patient has no serum creatinine result on file.). ------------------------------------------------------------------------------------------------------------------ No results for input(s): HGBA1C in the last 72 hours. ------------------------------------------------------------------------------------------------------------------ No results for input(s): CHOL, HDL, LDLCALC, TRIG, CHOLHDL, LDLDIRECT in the last 72 hours. ------------------------------------------------------------------------------------------------------------------  Recent Labs  11/20/15 1311  TSH 0.897   ------------------------------------------------------------------------------------------------------------------ No results for input(s): VITAMINB12, FOLATE, FERRITIN, TIBC, IRON, RETICCTPCT in the last 72 hours.  Coagulation profile  Recent Labs Lab 11/20/15 0110  INR 1.20    No results for input(s): DDIMER in the last 72 hours.  Cardiac Enzymes  Recent Labs Lab 11/20/15 0548  11/20/15 1311 11/20/15 1857  TROPONINI 0.05* 0.05* 0.06*   ------------------------------------------------------------------------------------------------------------------ Invalid input(s): POCBNP   CBG: No results for input(s): GLUCAP in the last 168 hours.     Studies: Dg Chest 1 View  11/20/2015  CLINICAL DATA:  Patient found on floor.  Initial encounter. EXAM: CHEST 1 VIEW COMPARISON:  Chest radiograph performed 07/12/2006 FINDINGS: The lungs are well-aerated and clear. There is no evidence of focal opacification, pleural effusion or pneumothorax. The cardiomediastinal silhouette is borderline normal in size. No acute osseous abnormalities are seen. Mild degenerative change is noted at the left glenohumeral joint. IMPRESSION: No acute cardiopulmonary process seen. Electronically Signed   By: Garald Balding M.D.   On: 11/20/2015 02:46   Ct Head Wo Contrast  11/20/2015  CLINICAL DATA:  Patient found on floor. Concern for head or cervical spine injury. Initial encounter. EXAM: CT HEAD WITHOUT CONTRAST CT CERVICAL SPINE WITHOUT CONTRAST TECHNIQUE: Multidetector CT imaging of the head and cervical spine was performed following the standard protocol without intravenous contrast. Multiplanar CT image reconstructions of the cervical spine were also generated. COMPARISON:  None. FINDINGS: CT HEAD FINDINGS There is no evidence of acute infarction, mass lesion, or intra- or extra-axial hemorrhage on CT. Prominence of the ventricles and sulci reflects mild cortical volume loss. Mild cerebellar atrophy is noted. Scattered periventricular and subcortical matter change likely reflects small vessel ischemic microangiopathy. The brainstem and fourth ventricle are within normal limits. The basal ganglia are unremarkable in appearance. The cerebral hemispheres demonstrate grossly normal gray-white differentiation. No mass effect or midline shift is seen. There is no evidence of fracture; visualized osseous  structures are unremarkable in appearance. The orbits are within normal limits. The paranasal sinuses and mastoid air cells are well-aerated. No significant soft tissue abnormalities are seen. CT CERVICAL SPINE FINDINGS There is no evidence of acute fracture or subluxation. Vertebral bodies demonstrate normal height. Mild grade 1 anterolisthesis is noted of C3 on C4 and of C4 on C5. Multilevel disc space narrowing is noted along the cervical spine, with scattered anterior and posterior disc osteophyte complexes. Prevertebral soft tissues are within normal limits. Facet disease is noted along the cervical spine. The thyroid gland is unremarkable in appearance. The visualized lung apices are clear. Scattered calcification is noted at the carotid bifurcations bilaterally. IMPRESSION: 1. No evidence of traumatic intracranial  injury or fracture. 2. No evidence of acute fracture or subluxation along the cervical spine. 3. Mild cortical volume loss and scattered small vessel ischemic microangiopathy. 4. Mild degenerative change along the cervical spine. 5. Scattered calcification at the carotid bifurcations bilaterally. Carotid ultrasound would be helpful for further evaluation, when and as deemed clinically appropriate. Electronically Signed   By: Garald Balding M.D.   On: 11/20/2015 02:54   Ct Chest W Contrast  11/20/2015  CLINICAL DATA:  Elevated LFTs, transaminitis, jaundice, total bilirubin =24.2, history hypertension EXAM: CT CHEST, ABDOMEN, AND PELVIS WITH CONTRAST TECHNIQUE: Multidetector CT imaging of the chest, abdomen and pelvis was performed following the standard protocol during bolus administration of intravenous contrast. Sagittal and coronal MPR images reconstructed from axial data set. CONTRAST:  168mL ISOVUE-300 IOPAMIDOL (ISOVUE-300) INJECTION 61% IV. Dilute oral contrast. COMPARISON:  None FINDINGS: CT CHEST FINDINGS Cardiovascular: Significant atherosclerotic calcification aorta, coronary arteries  and great vessels. Large dense calcified plaque at innominate artery. No aortic aneurysm or dissection. Pulmonary arteries grossly patent on nondedicated exam. Mediastinum/Lymph Nodes: Tracheobronchial cartilage calcification extending into hila. No thoracic adenopathy. Esophagus unremarkable. Lungs/Pleura: Lobulated mass with slightly spiculated margins in RIGHT lower lobe, partially chronic center, overall 2.2 x 2.2 x 2.4 cm compatible with tumor, question primary versus metastatic. Additional nodule with slightly ill-defined margins 7 mm diameter anterior RIGHT lower lobe image 54. Questionable subpleural nodule lingula 4 mm diameter image 57. Minimal bibasilar atelectasis. No infiltrate, pleural effusion, or pneumothorax. Musculoskeletal: Calcified subcoracoid loose body RIGHT shoulder with RIGHT glenohumeral degenerative changes. Advanced glenohumeral degenerative changes with spurring and calcific debris at LEFT glenohumeral joint. Diffuse osseous demineralization. No destructive bone lesions. CT ABDOMEN PELVIS FINDINGS Hepatobiliary: Marked intrahepatic and extrahepatic biliary dilatation. CBD 3.0 cm transverse. Poorly defined low-attenuation lesion at medial RIGHT lobe liver 14 x 12 mm question metastasis. Pancreas: Poorly defined low-attenuation mass at head and uncinate process of pancreas, 3.1 x 3.3 x 3.1 cm highly suspicious for a primary pancreatic neoplasm. Marked atrophy of the pancreatic body and tail with significant pancreatic ductal dilatation up to 11 mm diameter. Mass abuts the SMA and SMV as well as anterior margin of the aorta and third portion of duodenum. Spleen: Normal appearance Adrenals/Urinary Tract: Exophytic cyst lateral RIGHT kidney 2.0 x 1.4 cm image 71. Kidneys and adrenal glands otherwise normal appearance. No hydronephrosis or hydroureter. No urinary tract calcification. Bladder well distended and unremarkable. Stomach/Bowel: Descending duodenum displaced laterally by dilated CBD.  Stomach and bowel loops otherwise grossly normal appearance Vascular/Lymphatic: Extensive atherosclerotic calcifications aorta and abdominal branch vessels. Reproductive: Uterus surgically absent with nonvisualization of ovaries Other: No free air or free fluid Musculoskeletal: Degenerative disc disease changes greatest at L3-L41. No destructive bone lesions. IMPRESSION: Mass at head and uncinate process of pancreas 3.1 x 3.3 x 3.1 cm highly suspicious for a primary pancreatic neoplasm with associated marked intrahepatic and extrahepatic biliary dilatation. Suspected RIGHT lower lobe and hepatic metastases as above. Aortic atherosclerosis and coronary arterial atherosclerotic cyst. Findings discussed with Dr. Gala Romney prior to dictation of this report on 11/20/2015. Electronically Signed   By: Lavonia Dana M.D.   On: 11/20/2015 13:21   Ct Cervical Spine Wo Contrast  11/20/2015  CLINICAL DATA:  Patient found on floor. Concern for head or cervical spine injury. Initial encounter. EXAM: CT HEAD WITHOUT CONTRAST CT CERVICAL SPINE WITHOUT CONTRAST TECHNIQUE: Multidetector CT imaging of the head and cervical spine was performed following the standard protocol without intravenous contrast. Multiplanar CT image reconstructions of  the cervical spine were also generated. COMPARISON:  None. FINDINGS: CT HEAD FINDINGS There is no evidence of acute infarction, mass lesion, or intra- or extra-axial hemorrhage on CT. Prominence of the ventricles and sulci reflects mild cortical volume loss. Mild cerebellar atrophy is noted. Scattered periventricular and subcortical matter change likely reflects small vessel ischemic microangiopathy. The brainstem and fourth ventricle are within normal limits. The basal ganglia are unremarkable in appearance. The cerebral hemispheres demonstrate grossly normal gray-white differentiation. No mass effect or midline shift is seen. There is no evidence of fracture; visualized osseous structures are  unremarkable in appearance. The orbits are within normal limits. The paranasal sinuses and mastoid air cells are well-aerated. No significant soft tissue abnormalities are seen. CT CERVICAL SPINE FINDINGS There is no evidence of acute fracture or subluxation. Vertebral bodies demonstrate normal height. Mild grade 1 anterolisthesis is noted of C3 on C4 and of C4 on C5. Multilevel disc space narrowing is noted along the cervical spine, with scattered anterior and posterior disc osteophyte complexes. Prevertebral soft tissues are within normal limits. Facet disease is noted along the cervical spine. The thyroid gland is unremarkable in appearance. The visualized lung apices are clear. Scattered calcification is noted at the carotid bifurcations bilaterally. IMPRESSION: 1. No evidence of traumatic intracranial injury or fracture. 2. No evidence of acute fracture or subluxation along the cervical spine. 3. Mild cortical volume loss and scattered small vessel ischemic microangiopathy. 4. Mild degenerative change along the cervical spine. 5. Scattered calcification at the carotid bifurcations bilaterally. Carotid ultrasound would be helpful for further evaluation, when and as deemed clinically appropriate. Electronically Signed   By: Garald Balding M.D.   On: 11/20/2015 02:54   Ct Abdomen Pelvis W Contrast  11/20/2015  CLINICAL DATA:  Elevated LFTs, transaminitis, jaundice, total bilirubin =24.2, history hypertension EXAM: CT CHEST, ABDOMEN, AND PELVIS WITH CONTRAST TECHNIQUE: Multidetector CT imaging of the chest, abdomen and pelvis was performed following the standard protocol during bolus administration of intravenous contrast. Sagittal and coronal MPR images reconstructed from axial data set. CONTRAST:  165mL ISOVUE-300 IOPAMIDOL (ISOVUE-300) INJECTION 61% IV. Dilute oral contrast. COMPARISON:  None FINDINGS: CT CHEST FINDINGS Cardiovascular: Significant atherosclerotic calcification aorta, coronary arteries and  great vessels. Large dense calcified plaque at innominate artery. No aortic aneurysm or dissection. Pulmonary arteries grossly patent on nondedicated exam. Mediastinum/Lymph Nodes: Tracheobronchial cartilage calcification extending into hila. No thoracic adenopathy. Esophagus unremarkable. Lungs/Pleura: Lobulated mass with slightly spiculated margins in RIGHT lower lobe, partially chronic center, overall 2.2 x 2.2 x 2.4 cm compatible with tumor, question primary versus metastatic. Additional nodule with slightly ill-defined margins 7 mm diameter anterior RIGHT lower lobe image 54. Questionable subpleural nodule lingula 4 mm diameter image 57. Minimal bibasilar atelectasis. No infiltrate, pleural effusion, or pneumothorax. Musculoskeletal: Calcified subcoracoid loose body RIGHT shoulder with RIGHT glenohumeral degenerative changes. Advanced glenohumeral degenerative changes with spurring and calcific debris at LEFT glenohumeral joint. Diffuse osseous demineralization. No destructive bone lesions. CT ABDOMEN PELVIS FINDINGS Hepatobiliary: Marked intrahepatic and extrahepatic biliary dilatation. CBD 3.0 cm transverse. Poorly defined low-attenuation lesion at medial RIGHT lobe liver 14 x 12 mm question metastasis. Pancreas: Poorly defined low-attenuation mass at head and uncinate process of pancreas, 3.1 x 3.3 x 3.1 cm highly suspicious for a primary pancreatic neoplasm. Marked atrophy of the pancreatic body and tail with significant pancreatic ductal dilatation up to 11 mm diameter. Mass abuts the SMA and SMV as well as anterior margin of the aorta and third portion of duodenum. Spleen:  Normal appearance Adrenals/Urinary Tract: Exophytic cyst lateral RIGHT kidney 2.0 x 1.4 cm image 71. Kidneys and adrenal glands otherwise normal appearance. No hydronephrosis or hydroureter. No urinary tract calcification. Bladder well distended and unremarkable. Stomach/Bowel: Descending duodenum displaced laterally by dilated CBD.  Stomach and bowel loops otherwise grossly normal appearance Vascular/Lymphatic: Extensive atherosclerotic calcifications aorta and abdominal branch vessels. Reproductive: Uterus surgically absent with nonvisualization of ovaries Other: No free air or free fluid Musculoskeletal: Degenerative disc disease changes greatest at L3-L41. No destructive bone lesions. IMPRESSION: Mass at head and uncinate process of pancreas 3.1 x 3.3 x 3.1 cm highly suspicious for a primary pancreatic neoplasm with associated marked intrahepatic and extrahepatic biliary dilatation. Suspected RIGHT lower lobe and hepatic metastases as above. Aortic atherosclerosis and coronary arterial atherosclerotic cyst. Findings discussed with Dr. Gala Romney prior to dictation of this report on 11/20/2015. Electronically Signed   By: Lavonia Dana M.D.   On: 11/20/2015 13:21      No results found for: HGBA1C Lab Results  Component Value Date   CREATININE <0.30* 11/21/2015       Scheduled Meds: . ALPRAZolam  0.25 mg Oral TID  . ramipril  2.5 mg Oral Daily  . sodium chloride flush  3 mL Intravenous Q12H  . verapamil  240 mg Oral Daily   Continuous Infusions: . 0.9 % NaCl with KCl 40 mEq / L 100 mL/hr (11/20/15 2304)     LOS: 1 day    Time spent: >30 [facility] Pager [phone]. If 7PM-7AM, please contact night-coverage at [email or web address], password [facility] 11/21/2015, 8:16 AM  LOS: 1 day

## 2015-11-21 NOTE — Anesthesia Postprocedure Evaluation (Signed)
Anesthesia Post Note  Patient: Sophia Ramirez  Procedure(s) Performed: Procedure(s) (LRB): ENDOSCOPIC RETROGRADE CHOLANGIOPANCREATOGRAPHY (ERCP) (N/A) SPHINCTEROTOMY (N/A) BILIARY STENT PLACEMENT (N/A) ESOPHAGOGASTRODUODENOSCOPY (EGD) WITH PROPOFOL (N/A)  Patient location during evaluation: PACU Anesthesia Type: General Level of consciousness: awake and patient cooperative Pain management: pain level controlled Vital Signs Assessment: post-procedure vital signs reviewed and stable Respiratory status: spontaneous breathing, nonlabored ventilation and respiratory function stable Cardiovascular status: blood pressure returned to baseline Postop Assessment: no signs of nausea or vomiting Anesthetic complications: no    Last Vitals:  Filed Vitals:   11/21/15 1448 11/21/15 1633  BP: 165/85   Pulse: 80   Temp: 37 C 36.7 C  Resp:      Last Pain:  Filed Vitals:   11/21/15 1650  PainSc: 0-No pain                 Ilhan Debenedetto J

## 2015-11-21 NOTE — Consult Note (Signed)
Consultation Note Date: 11/21/2015   Patient Name: Sophia Ramirez  DOB: June 09, 1925  MRN: [id number]  Age / Sex: 82 y.o., female  PCP: Iona Beard, MD Referring Physician: Reyne Dumas, MD  Reason for Consultation: Establishing goals of care, Non pain symptom management and Psychosocial/spiritual support  HPI/Patient Profile: 82 y.o. female  with past medical history of Hypertension, status post cholecystectomy, anxiety, falls admitted on 11/20/2015 with hyponatremia, transaminitis, and increased bilirubin.   Clinical Assessment and Goals of Care: Mrs. Ureta is lying quietly in bed with her family at bedside, present today our daughter [name] Pinnix, oldest son Tona Sensing and his wife Fraser Din, youngest son Vicente Serene and his wife Loletha Carrow, granddaughter Seth Bake, and later in the conversation her brother Herbie Baltimore arrives.  We talk about Mrs. Robitaille's illness, her functional status, and her desires related to decision-making if she is unable. Transport arrives to take her to endo for her procedure.  Family and I continue to talk about Mrs. Colbaugh's choices. They share that they had not told her she had cancer, waiting until she had gotten through this procedure (ERCP). We talk about code status, healthcare power of attorney, I share a diagram of the chronic illness pathway. We talk about the benefits of in-home hospice. I encourage family to take 24 to 48 hours to see how Mrs. Killam's able to improve, before making decisions. I will continue to follow this family.  NEXT OF KIN - Mrs. Hammock states that she wants her adult children to make health care decisions for her she is unable. Family states that Jayfus Kelley should be the spokesperson,  home number [phone] cell phone number [phone]   SUMMARY OF RECOMMENDATIONS   continue to treat the treatable at this point, we discuss concepts such as allowing natural  death, the concepts of hospice in home. I encourage family to take some time to discuss their choices.    Code Status/Advance Care Planning:  Full code- family shares that Mrs. Bullman did not want to come to the hospital this time. Family states that they have heard, and agree that Mrs. Blust is not strong enough for chemotherapy if she were to need it.  We discuss the concepts of home health and in home hospice.   Symptom Management:   per hospitalist  Palliative Prophylaxis:   Frequent Pain Assessment, Palliative Wound Care and Turn Reposition  Additional Recommendations (Limitations, Scope, Preferences):  Continue to treat the treatable at this point, palliative medicine team will continue to discuss options with this family. We will discuss code status, in particular, tomorrow.   Psycho-social/Spiritual:   Desire for further Chaplaincy support:no  Additional Recommendations: Caregiving  Support/Resources and Education on Hospice  Prognosis:   < 6 months, likely based on metastatic disease burden, failure to thrive, frailty, weight loss.  Discharge Planning: Family shares that Mrs. Nathaniel's desire would be to return to her own home. We're discussing options, daughter-in-law Jocelyn Lamer, Therapist, sports, opens the discussion regarding hospice.      Primary Diagnoses: Present on  Admission:  . Hyponatremia  I have reviewed the medical record, interviewed the patient and family, and examined the patient. The following aspects are pertinent.  Past Medical History  Diagnosis Date  . Hypertension   . Arthritis    Social History   Social History  . Marital Status: Widowed    Spouse Name: N/A  . Number of Children: N/A  . Years of Education: N/A   Social History Main Topics  . Smoking status: Never Smoker   . Smokeless tobacco: None  . Alcohol Use: No  . Drug Use: No  . Sexual Activity: Not Asked   Other Topics Concern  . None   Social History Narrative   Family History    Problem Relation Age of Onset  . Colon cancer      unknown   Scheduled Meds: . ALPRAZolam  0.25 mg Oral TID  . ramipril  2.5 mg Oral Daily  . sodium chloride flush  3 mL Intravenous Q12H  . verapamil  240 mg Oral Daily   Continuous Infusions: . 0.9 % NaCl with KCl 40 mEq / L 100 mL/hr (11/21/15 0939)   PRN Meds:. Medications Prior to Admission:  Prior to Admission medications   Medication Sig Start Date End Date Taking? Authorizing Provider  acetaminophen (TYLENOL) 500 MG tablet Take 500 mg by mouth every 6 (six) hours as needed for pain.   Yes Historical Provider, MD  ALPRAZolam (XANAX) 0.25 MG tablet Take 0.25 mg by mouth 3 (three) times daily.   Yes Historical Provider, MD  ramipril (ALTACE) 2.5 MG tablet Take 2.5 mg by mouth daily.   Yes Historical Provider, MD  VERAPAMIL HCL ER, CO, PO Take 240 mg by mouth daily.   Yes Historical Provider, MD   No Known Allergies Review of Systems  Unable to perform ROS: Age    Physical Exam  Constitutional: No distress.  Frail and thin  HENT:  Head: Normocephalic.  Large indentation right temporal area  Cardiovascular: Normal rate.   Pulmonary/Chest: Effort normal. No respiratory distress.  Abdominal: Soft. There is no guarding.  Neurological: She is alert.  Unable to determine orientation at this time  Skin: Skin is warm and dry.  Markedly jaundiced  Nursing note and vitals reviewed.   Vital Signs: BP 154/67 mmHg  Pulse 72  Temp(Src) 98.9 F (37.2 C) (Oral)  Resp 20  Ht 5\' 5"  (1.651 m)  Wt 62.596 kg (138 lb)  BMI 22.96 kg/m2  SpO2 97% Pain Assessment: PAINAD   Pain Score: 0-No pain   SpO2: SpO2: 97 % O2 Device:SpO2: 97 % O2 Flow Rate: .   IO: Intake/output summary:  Intake/Output Summary (Last 24 hours) at 11/21/15 1406 Last data filed at 11/21/15 0930  Gross per 24 hour  Intake   1240 ml  Output      5 ml  Net   1235 ml    LBM: Last BM Date: 11/19/15 Baseline Weight: Weight: 62.596 kg (138 lb) Most  recent weight: Weight: 62.596 kg (138 lb)     Palliative Assessment/Data:     Time In: 1430 Time Out: 1545 Time Total: 75 minutes Greater than 50%  of this time was spent counseling and coordinating care related to the above assessment and plan.  Signed by: Drue Novel, NP   Please contact Palliative Medicine Team phone at [phone] for questions and concerns.  For individual provider: See Shea Evans

## 2015-11-22 DIAGNOSIS — R740 Nonspecific elevation of levels of transaminase and lactic acid dehydrogenase [LDH]: Secondary | ICD-10-CM

## 2015-11-22 DIAGNOSIS — R7401 Elevation of levels of liver transaminase levels: Secondary | ICD-10-CM | POA: Insufficient documentation

## 2015-11-22 DIAGNOSIS — C259 Malignant neoplasm of pancreas, unspecified: Principal | ICD-10-CM

## 2015-11-22 LAB — COMPREHENSIVE METABOLIC PANEL
ALBUMIN: 2.1 g/dL — AB (ref 3.5–5.0)
ALT: 156 U/L — ABNORMAL HIGH (ref 14–54)
ANION GAP: 8 (ref 5–15)
AST: 223 U/L — ABNORMAL HIGH (ref 15–41)
Alkaline Phosphatase: 387 U/L — ABNORMAL HIGH (ref 38–126)
BUN: 11 mg/dL (ref 6–20)
CO2: 26 mmol/L (ref 22–32)
Calcium: 7.1 mg/dL — ABNORMAL LOW (ref 8.9–10.3)
Chloride: 91 mmol/L — ABNORMAL LOW (ref 101–111)
Creatinine, Ser: <0.3 mg/dL — ABNORMAL LOW (ref 0.44–1.00)
GLUCOSE: 99 mg/dL (ref 65–99)
POTASSIUM: 3.9 mmol/L (ref 3.5–5.1)
SODIUM: 125 mmol/L — AB (ref 135–145)
Total Bilirubin: 20.9 mg/dL (ref 0.3–1.2)
Total Protein: 5.9 g/dL — ABNORMAL LOW (ref 6.5–8.1)

## 2015-11-22 LAB — CBC
HEMATOCRIT: 27.2 % — AB (ref 36.0–46.0)
HEMOGLOBIN: 9.5 g/dL — AB (ref 12.0–15.0)
MCH: 28 pg (ref 26.0–34.0)
MCHC: 34.9 g/dL (ref 30.0–36.0)
MCV: 80.2 fL (ref 78.0–100.0)
Platelets: 427 10*3/uL — ABNORMAL HIGH (ref 150–400)
RBC: 3.39 MIL/uL — AB (ref 3.87–5.11)
RDW: 18.8 % — ABNORMAL HIGH (ref 11.5–15.5)
WBC: 10.4 10*3/uL (ref 4.0–10.5)

## 2015-11-22 NOTE — Progress Notes (Signed)
Daily Progress Note   Patient Name: Sophia Ramirez       Date: 11/22/2015 DOB: 12-14-1925  Age: 82 y.o. MRN#: [id number] Attending Physician: Orvan Falconer, MD Primary Care Physician: [name], MD Admit Date: 11/20/2015  Reason for Consultation/Follow-up: Disposition, Establishing goals of care and Psychosocial/spiritual support  Subjective: Sophia Ramirez is resting quietly in bed with her daughter Sophia Ramirez at bedside fixing her hair.  Sophia Ramirez makes motions out of her mother site, waving her arms and stating we're not telling her.  Sophia Ramirez denies pain or anxiety at this point. She has no concerns or questions. I share that we're trying to make a good plan for her, and she states, "you're trying to send me to a nursing home". I share that this is not the case, but we are trying to make sure that she has the care she needs. I ask her daughter Sophia Ramirez if family has been able to have any discussions. She states that they have not, and are waiting for brother Sophia Ramirez to return from his beach vacation.  No further questions or concerns at this time.  Length of Stay: 2  Current Medications: Scheduled Meds:  . ALPRAZolam  0.25 mg Oral TID  . pantoprazole  40 mg Oral Daily  . ramipril  2.5 mg Oral Daily  . sodium chloride flush  3 mL Intravenous Q12H  . verapamil  240 mg Oral Daily    Continuous Infusions: . 0.9 % NaCl with KCl 40 mEq / L 100 mL/hr (11/22/15 1054)    PRN Meds: acetaminophen  Physical Exam  Constitutional: No distress.  Frail and jaundiced   HENT:  Right temporal area depressed.   Cardiovascular: Normal rate and regular rhythm.   Pulmonary/Chest: Effort normal. No respiratory distress.  Abdominal: Soft. There is no guarding.  Neurological: She is alert.  Skin: Skin is  warm and dry.  Jaundiced   Nursing note and vitals reviewed.           Vital Signs: BP 131/59 mmHg  Pulse 72  Temp(Src) 99.4 F (37.4 C) (Oral)  Resp 20  Ht 5\' 5"  (1.651 m)  Wt 62.596 kg (138 lb)  BMI 22.96 kg/m2  SpO2 100% SpO2: SpO2: 100 % O2 Device: O2 Device: Not Delivered O2 Flow Rate: O2 Flow Rate (L/min): 10 L/min  Intake/output summary:  Intake/Output Summary (Last 24 hours) at 11/22/15 1545 Last data filed at 11/21/15 1625  Gross per 24 hour  Intake    500 ml  Output      0 ml  Net    500 ml   LBM: Last BM Date: 11/21/15 Baseline Weight: Weight: 62.596 kg (138 lb) Most recent weight: Weight: 62.596 kg (138 lb)       Palliative Assessment/Data:    Flowsheet Rows        Most Recent Value   Intake Tab    Referral Department  Hospitalist   Unit at Time of Referral  Med/Surg Unit   Palliative Care Primary Diagnosis  Cancer   Date Notified  11/20/15   Palliative Care Type  New Palliative care   Reason for referral  Clarify Goals of Care   Date of Admission  11/20/15   Date first seen by Palliative Care  11/21/15   # of days Palliative referral response time  1 Day(s)   # of days IP prior to Palliative referral  0   Clinical Assessment    Psychosocial & Spiritual Assessment    Palliative Care Outcomes       Patient Active Problem List   Diagnosis Date Noted  . Fall   . Hyperbilirubinemia   . Pancreatic cancer ([place])   . Palliative care encounter   . DNR (do not resuscitate) discussion   . Goals of care, counseling/discussion   . Hyponatremia 11/20/2015  . Elevated LFTs   . GERD 01/13/2008  . RENAL CYST 01/13/2008  . NAUSEA WITH VOMITING 01/13/2008  . ABDOMINAL PAIN, UNSPECIFIED SITE 01/13/2008  . CONSTIPATION, HX OF 01/13/2008  . HYPERTENSION 12/23/2007  . 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 WEIGHT LOSS 12/23/2007  . DYSPHAGIA UNSPECIFIED 12/23/2007    Palliative Care Assessment & Plan   Patient Profile: Sophia Ramirez is an 82 y.o. 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 She has no abdominal pain, and her total bili is improving. No other complaints.  Assessment: As above   Recommendations/Plan:  Continue to treat the treatable, family has not talked about code status (waiting for brother to return from vacation). Family is working on decisions about care at home with [facility] vs Hospice vs SNF for rehab.   Goals of Care and Additional Recommendations:  Limitations on Scope of Treatment: Continue to treat the treatable, BUT NO CHEMOTHERAPY  Code Status:    Code Status Orders        Start     Ordered   11/20/15 0530  Full code   Continuous     11/20/15 0529    Code Status History    Date Active Date Inactive Code Status Order ID Comments User Context   This patient has a current code status but no historical code status.       Prognosis:   < 6 months, based on metastatic disease burden related to pancreatic cancer.   Discharge Planning:  To Be Determined  Care plan was discussed with nursing staff, CM, SW, and Dr. Marin Comment.   Thank you for allowing the Palliative Medicine Team to assist in the care of this patient.   Time In: 1210 Time Out: 1225 Total Time 15 minutes Prolonged Time Billed  no       Greater than 50%  of this time was spent counseling and coordinating care related to the above  assessment and plan.  Elany Felix A, NP  Please contact Palliative Medicine Team phone at [phone] for questions and concerns.

## 2015-11-22 NOTE — Care Management Important Message (Signed)
Important Message  Patient Details  Name: Sophia Ramirez MRN: [id number] Date of Birth: 18-Oct-1925   Medicare Important Message Given:  Yes    Sherald Barge, RN 11/22/2015, 9:13 AM

## 2015-11-22 NOTE — Addendum Note (Signed)
Addendum  created 11/22/15 0802 by Vista Deck, CRNA   Modules edited: Notes Section   Notes Section:  File: [id number]

## 2015-11-22 NOTE — Progress Notes (Signed)
[facility] PROGRESS NOTE  AUSTIN GIANNOTTI [id number] DOB: 1925-07-18    PCP:   [name], MD   HPI: Sophia Ramirez is an 82 y.o. 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  She has no abdominal pain, and her total bili is improving.  No other complaints.   Rewiew of Systems:  Constitutional: Negative for malaise, fever and chills. No significant weight loss or weight gain Eyes: Negative for eye pain, redness and discharge, diplopia, visual changes, or flashes of light. ENMT: Negative for ear pain, hoarseness, nasal congestion, sinus pressure and sore throat. No headaches; tinnitus, drooling, or problem swallowing. Cardiovascular: Negative for chest pain, palpitations, diaphoresis, dyspnea and peripheral edema. ; No orthopnea, PND Respiratory: Negative for cough, hemoptysis, wheezing and stridor. No pleuritic chestpain. Gastrointestinal: Negative for nausea, vomiting, diarrhea, constipation, abdominal pain, melena, blood in stool, hematemesis, jaundice and rectal bleeding.    Genitourinary: Negative for frequency, dysuria, incontinence,flank pain and hematuria; Musculoskeletal: Negative for back pain and neck pain. Negative for swelling and trauma.;  Skin: . Negative for pruritus, rash, abrasions, bruising and skin lesion.; ulcerations Neuro: Negative for headache, lightheadedness and neck stiffness. Negative for weakness, altered level of consciousness , altered mental status, extremity weakness, burning feet, involuntary movement, seizure and syncope.  Psych: negative for anxiety, depression, insomnia, tearfulness, panic attacks, hallucinations, paranoia, suicidal or homicidal ideation    Past Medical History  Diagnosis Date  . Hypertension   . Arthritis     Past Surgical History  Procedure Laterality Date  . Shoulder surgery    . Cholecystectomy    . Abdominal  hysterectomy      Medications:  HOME MEDS: Prior to Admission medications   Medication Sig Start Date End Date Taking? Authorizing Provider  acetaminophen (TYLENOL) 500 MG tablet Take 500 mg by mouth every 6 (six) hours as needed for pain.   Yes Historical Provider, MD  ALPRAZolam (XANAX) 0.25 MG tablet Take 0.25 mg by mouth 3 (three) times daily.   Yes Historical Provider, MD  ramipril (ALTACE) 2.5 MG tablet Take 2.5 mg by mouth daily.   Yes Historical Provider, MD     Allergies:  No Known Allergies  Social History:   reports that she has never smoked. She does not have any smokeless tobacco history on file. She reports that she does not drink alcohol or use illicit drugs.  Family History: Family History  Problem Relation Age of Onset  . Colon cancer      unknown     Physical Exam: Filed Vitals:   11/21/15 1745 11/21/15 1827 11/21/15 2242 11/22/15 0508  BP: 156/69 150/64 153/73 146/85  Pulse: 68 67 79 74  Temp: 97.6 F (36.4 C) 97.6 F (36.4 C) 98.5 F (36.9 C) 98.3 F (36.8 C)  TempSrc: Oral Oral Oral Oral  Resp: 20 18 20 20   Height:      Weight:      SpO2: 99% 99% 98% 98%   Blood pressure 146/85, pulse 74, temperature 98.3 F (36.8 C), temperature source Oral, resp. rate 20, height 5\' 5"  (1.651 m), weight 62.596 kg (138 lb), SpO2 98 %.  GEN:  Pleasant  patient lying in the stretcher in no acute distress; cooperative with exam. PSYCH:  alert and oriented x4; does not appear anxious or depressed; affect is appropriate. HEENT: Mucous membranes pink and anicteric; PERRLA; EOM intact; no cervical lymphadenopathy nor thyromegaly or carotid bruit;  no JVD; There were no stridor. Neck is very supple. Breasts:: Not examined CHEST WALL: No tenderness CHEST: Normal respiration, clear to auscultation bilaterally.  HEART: Regular rate and rhythm.  There are no murmur, rub, or gallops.   BACK: No kyphosis or scoliosis; no CVA tenderness ABDOMEN: soft and non-tender; no  masses, no organomegaly, normal abdominal bowel sounds; no pannus; no intertriginous candida. There is no rebound and no distention. Rectal Exam: Not done EXTREMITIES: No bone or joint deformity; age-appropriate arthropathy of the hands and knees; no edema; no ulcerations.  There is no calf tenderness. Genitalia: not examined PULSES: 2+ and symmetric SKIN: Normal hydration no rash or ulceration CNS: Cranial nerves 2-12 grossly intact no focal lateralizing neurologic deficit.  Speech is fluent; uvula elevated with phonation, facial symmetry and tongue midline. DTR are normal bilaterally, cerebella exam is intact, barbinski is negative and strengths are equaled bilaterally.  No sensory loss.   Labs on Admission:  Basic Metabolic Panel:  Recent Labs Lab 11/20/15 0110 11/20/15 0548 11/20/15 0905 11/21/15 0537 11/22/15 0523  NA 122* 125* 123* 125* 125*  K 3.4* 3.1* 3.2* 3.3* 3.9  CL 82* 85* 85* 90* 91*  CO2 25 27 26 26 26   GLUCOSE 155* 109* 112* 94 99  BUN 20 17 15 11 11   CREATININE <0.30* <0.30* <0.30* <0.30* <0.30*  CALCIUM 8.4* 8.0* 7.9* 7.4* 7.1*   Liver Function Tests:  Recent Labs Lab 11/20/15 0110 11/20/15 0548 11/21/15 0537 11/22/15 0523  AST 245* 212* 212* 223*  ALT 172* 153* 147* 156*  ALKPHOS 454* 406* 401* 387*  BILITOT 27.7* 24.2* 23.7* 20.9*  PROT 7.4 6.6 6.1* 5.9*  ALBUMIN 2.9* 2.5* 2.2* 2.1*    Recent Labs Lab 11/20/15 0110  LIPASE 30    Recent Labs Lab 11/20/15 0200  AMMONIA 33   CBC:  Recent Labs Lab 11/20/15 0110 11/20/15 0548 11/21/15 0537 11/22/15 0523  WBC 10.2 9.2 9.0 10.4  NEUTROABS 8.5*  --   --   --   HGB 11.1* 10.1* 9.7* 9.5*  HCT 30.9* 28.3* 27.4* 27.2*  MCV 80.3 80.2 80.1 80.2  PLT 447* 399 426* 427*   Cardiac Enzymes:  Recent Labs Lab 11/20/15 0110 11/20/15 0548 11/20/15 1311 11/20/15 1857  CKTOTAL 222  --   --   --   TROPONINI 0.04* 0.05* 0.05* 0.06*    Radiological Exams on Admission: Ct Chest W  Contrast  11/20/2015  CLINICAL DATA:  Elevated LFTs, transaminitis, jaundice, total bilirubin =24.2, history hypertension EXAM: CT CHEST, ABDOMEN, AND PELVIS WITH CONTRAST TECHNIQUE: Multidetector CT imaging of the chest, abdomen and pelvis was performed following the standard protocol during bolus administration of intravenous contrast. Sagittal and coronal MPR images reconstructed from axial data set. CONTRAST:  127mL ISOVUE-300 IOPAMIDOL (ISOVUE-300) INJECTION 61% IV. Dilute oral contrast. COMPARISON:  None FINDINGS: CT CHEST FINDINGS Cardiovascular: Significant atherosclerotic calcification aorta, coronary arteries and great vessels. Large dense calcified plaque at innominate artery. No aortic aneurysm or dissection. Pulmonary arteries grossly patent on nondedicated exam. Mediastinum/Lymph Nodes: Tracheobronchial cartilage calcification extending into hila. No thoracic adenopathy. Esophagus unremarkable. Lungs/Pleura: Lobulated mass with slightly spiculated margins in RIGHT lower lobe, partially chronic center, overall 2.2 x 2.2 x 2.4 cm compatible with tumor, question primary versus metastatic. Additional nodule with slightly ill-defined margins 7 mm diameter anterior RIGHT lower lobe image 54. Questionable subpleural nodule lingula 4 mm diameter image 57. Minimal bibasilar atelectasis. No infiltrate, pleural effusion, or pneumothorax. Musculoskeletal: Calcified subcoracoid loose body RIGHT shoulder with  RIGHT glenohumeral degenerative changes. Advanced glenohumeral degenerative changes with spurring and calcific debris at LEFT glenohumeral joint. Diffuse osseous demineralization. No destructive bone lesions. CT ABDOMEN PELVIS FINDINGS Hepatobiliary: Marked intrahepatic and extrahepatic biliary dilatation. CBD 3.0 cm transverse. Poorly defined low-attenuation lesion at medial RIGHT lobe liver 14 x 12 mm question metastasis. Pancreas: Poorly defined low-attenuation mass at head and uncinate process of pancreas,  3.1 x 3.3 x 3.1 cm highly suspicious for a primary pancreatic neoplasm. Marked atrophy of the pancreatic body and tail with significant pancreatic ductal dilatation up to 11 mm diameter. Mass abuts the SMA and SMV as well as anterior margin of the aorta and third portion of duodenum. Spleen: Normal appearance Adrenals/Urinary Tract: Exophytic cyst lateral RIGHT kidney 2.0 x 1.4 cm image 71. Kidneys and adrenal glands otherwise normal appearance. No hydronephrosis or hydroureter. No urinary tract calcification. Bladder well distended and unremarkable. Stomach/Bowel: Descending duodenum displaced laterally by dilated CBD. Stomach and bowel loops otherwise grossly normal appearance Vascular/Lymphatic: Extensive atherosclerotic calcifications aorta and abdominal branch vessels. Reproductive: Uterus surgically absent with nonvisualization of ovaries Other: No free air or free fluid Musculoskeletal: Degenerative disc disease changes greatest at L3-L41. No destructive bone lesions. IMPRESSION: Mass at head and uncinate process of pancreas 3.1 x 3.3 x 3.1 cm highly suspicious for a primary pancreatic neoplasm with associated marked intrahepatic and extrahepatic biliary dilatation. Suspected RIGHT lower lobe and hepatic metastases as above. Aortic atherosclerosis and coronary arterial atherosclerotic cyst. Findings discussed with Dr. Gala Romney prior to dictation of this report on 11/20/2015. Electronically Signed   By: Lavonia Dana M.D.   On: 11/20/2015 13:21   Ct Abdomen Pelvis W Contrast  11/20/2015  CLINICAL DATA:  Elevated LFTs, transaminitis, jaundice, total bilirubin =24.2, history hypertension EXAM: CT CHEST, ABDOMEN, AND PELVIS WITH CONTRAST TECHNIQUE: Multidetector CT imaging of the chest, abdomen and pelvis was performed following the standard protocol during bolus administration of intravenous contrast. Sagittal and coronal MPR images reconstructed from axial data set. CONTRAST:  149mL ISOVUE-300 IOPAMIDOL  (ISOVUE-300) INJECTION 61% IV. Dilute oral contrast. COMPARISON:  None FINDINGS: CT CHEST FINDINGS Cardiovascular: Significant atherosclerotic calcification aorta, coronary arteries and great vessels. Large dense calcified plaque at innominate artery. No aortic aneurysm or dissection. Pulmonary arteries grossly patent on nondedicated exam. Mediastinum/Lymph Nodes: Tracheobronchial cartilage calcification extending into hila. No thoracic adenopathy. Esophagus unremarkable. Lungs/Pleura: Lobulated mass with slightly spiculated margins in RIGHT lower lobe, partially chronic center, overall 2.2 x 2.2 x 2.4 cm compatible with tumor, question primary versus metastatic. Additional nodule with slightly ill-defined margins 7 mm diameter anterior RIGHT lower lobe image 54. Questionable subpleural nodule lingula 4 mm diameter image 57. Minimal bibasilar atelectasis. No infiltrate, pleural effusion, or pneumothorax. Musculoskeletal: Calcified subcoracoid loose body RIGHT shoulder with RIGHT glenohumeral degenerative changes. Advanced glenohumeral degenerative changes with spurring and calcific debris at LEFT glenohumeral joint. Diffuse osseous demineralization. No destructive bone lesions. CT ABDOMEN PELVIS FINDINGS Hepatobiliary: Marked intrahepatic and extrahepatic biliary dilatation. CBD 3.0 cm transverse. Poorly defined low-attenuation lesion at medial RIGHT lobe liver 14 x 12 mm question metastasis. Pancreas: Poorly defined low-attenuation mass at head and uncinate process of pancreas, 3.1 x 3.3 x 3.1 cm highly suspicious for a primary pancreatic neoplasm. Marked atrophy of the pancreatic body and tail with significant pancreatic ductal dilatation up to 11 mm diameter. Mass abuts the SMA and SMV as well as anterior margin of the aorta and third portion of duodenum. Spleen: Normal appearance Adrenals/Urinary Tract: Exophytic cyst lateral RIGHT kidney 2.0 x 1.4  cm image 71. Kidneys and adrenal glands otherwise normal  appearance. No hydronephrosis or hydroureter. No urinary tract calcification. Bladder well distended and unremarkable. Stomach/Bowel: Descending duodenum displaced laterally by dilated CBD. Stomach and bowel loops otherwise grossly normal appearance Vascular/Lymphatic: Extensive atherosclerotic calcifications aorta and abdominal branch vessels. Reproductive: Uterus surgically absent with nonvisualization of ovaries Other: No free air or free fluid Musculoskeletal: Degenerative disc disease changes greatest at L3-L41. No destructive bone lesions. IMPRESSION: Mass at head and uncinate process of pancreas 3.1 x 3.3 x 3.1 cm highly suspicious for a primary pancreatic neoplasm with associated marked intrahepatic and extrahepatic biliary dilatation. Suspected RIGHT lower lobe and hepatic metastases as above. Aortic atherosclerosis and coronary arterial atherosclerotic cyst. Findings discussed with Dr. Gala Romney prior to dictation of this report on 11/20/2015. Electronically Signed   By: Lavonia Dana M.D.   On: 11/20/2015 13:21   Dg Ercp Biliary & Pancreatic Ducts  11/21/2015  CLINICAL DATA:  ERCP with sphincterotomy and stent placement for pancreatic cancer EXAM: ERCP TECHNIQUE: Multiple spot images obtained with the fluoroscopic device and submitted for interpretation post-procedure. FLUOROSCOPY TIME:  Radiation Exposure Index (as provided by the fluoroscopic device): 131 mGy COMPARISON:  11/20/2015 FINDINGS: Sequential images obtained during ERCP show catheterization of the common bile duct. Contrast injection demonstrates a markedly dilated common bile duct with abrupt cut off at the expected level of the pancreatic head. On the final images metallic stent placement can be seen. IMPRESSION: 1. ERCP demonstrates common bile duct obstruction secondary to pancreatic head mass. A metallic stent is placed across the level of obstruction. These images were submitted for radiologic interpretation only. Please see the procedural  report for the amount of contrast and the fluoroscopy time utilized. Electronically Signed   By: Kerby Moors M.D.   On: 11/21/2015 16:29   A/P:   1. Hyponatremia- likely from dehydration/SIADH due to lung mets , continue normal saline at 100 mL per hour. TSH nl , cortisol 27.6 , CT of the chest shows spiculated mass in the right lower lobe.  Will restrict fluid to about 1200cc/day.  2. Obstructive jaundice/pancreatic cancer- patient has transaminitis with elevated AST, ALT, alkaline phosphatase, bilirubin elevated to 23.7, now coming down. She had seen palliative care medicine, and is thinking about her GOC. Currently still full code. She is tolerating food OK.  3. Hypertension- continue Altace , verapamil. 4. Elevated troponin- troponin is 0.04, total CK is 222.  5. Fever-suspect tumor fever , white count normal, doubt cholangitis   DVT prophylaxsis Lovenox on hold for procedure  Code Status: Full code   Orvan Falconer, MD.  FACP [facility] Pager [phone] 7pm to 7am.  11/22/2015, 9:16 AM

## 2015-11-22 NOTE — Evaluation (Signed)
Physical Therapy Evaluation Patient Details Name: Sophia Ramirez MRN: [id number] DOB: Nov 03, 1925 Today's Date: 11/22/2015   History of Present Illness  Sophia Ramirez is a 82 y.o. female, With history of hypertension, status post cholecystectomy who was brought to the ED after patient was found on the floor. As per daughter patient was underfilled both third 49 hours. She was last seen on Saturday night. Patient stated that she was going to get up from the air conditioning off in her room and stripped out of the bed onto the floor. Family said they were able to get off the floor and put her back in bed but she has not ambulated.  Clinical Impression  Pt is fearful of attempting to walk.  Pt is very pleasant and cooperative.  She has decreased mobility, strength and balance and will benefit from SNF prior to going home.  The patient does not want to do this she wants to go home.  If she can have 24 hour assistance than she can go home with home health services but she will not be safe to be at home alone for any time period.     Follow Up Recommendations SNF    Equipment Recommendations   rolling walker   Recommendations for Other Services   none    Precautions / Restrictions Precautions Precautions: Fall Restrictions Weight Bearing Restrictions: No      Mobility  Bed Mobility Overal bed mobility: Needs Assistance Bed Mobility: Supine to Sit;Sit to Supine     Supine to sit: Min assist Sit to supine: Min assist      Transfers                 General transfer comment: Pt to fearful to attempt at thie time.  Attempted sit to stand but pt became fearful and pushed back                   Pertinent Vitals/Pain Pain Assessment: No/denies pain    Home Living Family/patient expects to be discharged to:: Private residence Living Arrangements: Alone Available Help at Discharge: Family Type of Home: House Home Access: Stairs to enter   [employer]  of Steps: 3; pt does not go up herself she only goes up with her daughter or son Home Layout: One level Home Equipment: None      Prior Function Level of Independence: Needs assistance   Gait / Transfers Assistance Needed: householde ambulator.  Does not use assistive device but states that she "furniture walks"              Extremity/Trunk Assessment    weakness in B LE with strength approximately 2+ to 3/5 bilaterally                      Communication   Communication: No difficulties  Cognition Arousal/Alertness: Awake/alert Behavior During Therapy: WFL for tasks assessed/performed Overall Cognitive Status: Within Functional Limits for tasks assessed                         Exercises General Exercises - Lower Extremity Ankle Circles/Pumps: Both;10 reps Quad Sets: Both;10 reps Gluteal Sets: Both;10 reps Heel Slides: Both;10 reps Hip ABduction/ADduction: Both;10 reps      Assessment/Plan    PT Assessment Patient needs continued PT services  PT Diagnosis Difficulty walking;Generalized weakness   PT Problem List Decreased strength;Decreased activity tolerance;Decreased mobility;Decreased balance  PT Treatment Interventions Gait training;Therapeutic activities;Therapeutic exercise;Balance training;DME  instruction   PT Goals (Current goals can be found in the Care Plan section) Acute Rehab PT Goals Patient Stated Goal: to go home PT Goal Formulation: With patient Potential to Achieve Goals: Good    Frequency Min 4X/week   Barriers to discharge           End of Session Equipment Utilized During Treatment: Gait belt Activity Tolerance: Patient limited by fatigue Patient left: in bed;with call bell/phone within reach;with bed alarm set           Time: [id number] PT Time Calculation (min) (ACUTE ONLY): 25 min   Charges:   PT Evaluation $PT Eval Moderate Complexity: 1 Procedure     PT G CodesRayetta Humphrey, PT  CLT [phone] 11/22/2015, 9:20 AM

## 2015-11-22 NOTE — Anesthesia Postprocedure Evaluation (Signed)
Anesthesia Post Note  Patient: Sophia Ramirez  Procedure(s) Performed: Procedure(s) (LRB): ENDOSCOPIC RETROGRADE CHOLANGIOPANCREATOGRAPHY (ERCP) (N/A) SPHINCTEROTOMY (N/A) BILIARY STENT PLACEMENT (N/A) ESOPHAGOGASTRODUODENOSCOPY (EGD) WITH PROPOFOL (N/A)  Patient location during evaluation: Nursing Unit Anesthesia Type: General Level of consciousness: awake Pain management: satisfactory to patient Vital Signs Assessment: post-procedure vital signs reviewed and stable Respiratory status: spontaneous breathing Cardiovascular status: stable Anesthetic complications: no Comments: Labs improving    Last Vitals:  Filed Vitals:   11/21/15 2242 11/22/15 0508  BP: 153/73 146/85  Pulse: 79 74  Temp: 36.9 C 36.8 C  Resp: 20 20    Last Pain:  Filed Vitals:   11/22/15 0508  PainSc: 0-No pain                 Ilayda Toda

## 2015-11-22 NOTE — Progress Notes (Signed)
Subjective:  Patient alert. Tolerated breakfast. Wants to go home.   Objective: Vital signs in last 24 hours: Temp:  [97.6 F (36.4 C)-98.6 F (37 C)] 98.3 F (36.8 C) (07/05 0508) Pulse Rate:  [64-80] 74 (07/05 0508) Resp:  [18-26] 20 (07/05 0508) BP: (146-166)/(63-85) 146/85 mmHg (07/05 0508) SpO2:  [94 %-100 %] 98 % (07/05 0508) Last BM Date: 11/21/15 General:   Alert,  Well-developed, well-nourished, pleasant and cooperative in NAD Head:  Normocephalic and atraumatic. Eyes:  Sclera icterus.  Abdomen:  Soft, nontender and nondistended.   Normal bowel sounds, without guarding, and without rebound.   Extremities:  Without clubbing, deformity or edema. Neurologic:  Alert and  oriented x4;  grossly normal neurologically. Skin:  Intact without significant lesions or rashes. +jaundice Psych:  Alert and cooperative. Normal mood and affect.  Intake/Output from previous day: 07/04 0701 - 07/05 0700 In: 500 [I.V.:500] Out: 1 [Urine:1] Intake/Output this shift:    Lab Results: CBC  Recent Labs  11/20/15 0548 11/21/15 0537 11/22/15 0523  WBC 9.2 9.0 10.4  HGB 10.1* 9.7* 9.5*  HCT 28.3* 27.4* 27.2*  MCV 80.2 80.1 80.2  PLT 399 426* 427*   BMET  Recent Labs  11/20/15 0905 11/21/15 0537 11/22/15 0523  NA 123* 125* 125*  K 3.2* 3.3* 3.9  CL 85* 90* 91*  CO2 26 26 26   GLUCOSE 112* 94 99  BUN 15 11 11   CREATININE <0.30* <0.30* <0.30*  CALCIUM 7.9* 7.4* 7.1*   LFTs  Recent Labs  11/20/15 0548 11/21/15 0537 11/22/15 0523  BILITOT 24.2* 23.7* 20.9*  BILIDIR 15.0*  --   --   ALKPHOS 406* 401* 387*  AST 212* 212* 223*  ALT 153* 147* 156*  PROT 6.6 6.1* 5.9*  ALBUMIN 2.5* 2.2* 2.1*    Recent Labs  11/20/15 0110  LIPASE 30   PT/INR  Recent Labs  11/20/15 0110  LABPROT 15.4*  INR 1.20      Imaging Studies: Dg Chest 1 View  11/20/2015  CLINICAL DATA:  Patient found on floor.  Initial encounter. EXAM: CHEST 1 VIEW COMPARISON:  Chest radiograph  performed 07/12/2006 FINDINGS: The lungs are well-aerated and clear. There is no evidence of focal opacification, pleural effusion or pneumothorax. The cardiomediastinal silhouette is borderline normal in size. No acute osseous abnormalities are seen. Mild degenerative change is noted at the left glenohumeral joint. IMPRESSION: No acute cardiopulmonary process seen. Electronically Signed   By: Garald Balding M.D.   On: 11/20/2015 02:46   Ct Head Wo Contrast  11/20/2015  CLINICAL DATA:  Patient found on floor. Concern for head or cervical spine injury. Initial encounter. EXAM: CT HEAD WITHOUT CONTRAST CT CERVICAL SPINE WITHOUT CONTRAST TECHNIQUE: Multidetector CT imaging of the head and cervical spine was performed following the standard protocol without intravenous contrast. Multiplanar CT image reconstructions of the cervical spine were also generated. COMPARISON:  None. FINDINGS: CT HEAD FINDINGS There is no evidence of acute infarction, mass lesion, or intra- or extra-axial hemorrhage on CT. Prominence of the ventricles and sulci reflects mild cortical volume loss. Mild cerebellar atrophy is noted. Scattered periventricular and subcortical matter change likely reflects small vessel ischemic microangiopathy. The brainstem and fourth ventricle are within normal limits. The basal ganglia are unremarkable in appearance. The cerebral hemispheres demonstrate grossly normal gray-white differentiation. No mass effect or midline shift is seen. There is no evidence of fracture; visualized osseous structures are unremarkable in appearance. The orbits are within normal limits. The paranasal sinuses  and mastoid air cells are well-aerated. No significant soft tissue abnormalities are seen. CT CERVICAL SPINE FINDINGS There is no evidence of acute fracture or subluxation. Vertebral bodies demonstrate normal height. Mild grade 1 anterolisthesis is noted of C3 on C4 and of C4 on C5. Multilevel disc space narrowing is noted along  the cervical spine, with scattered anterior and posterior disc osteophyte complexes. Prevertebral soft tissues are within normal limits. Facet disease is noted along the cervical spine. The thyroid gland is unremarkable in appearance. The visualized lung apices are clear. Scattered calcification is noted at the carotid bifurcations bilaterally. IMPRESSION: 1. No evidence of traumatic intracranial injury or fracture. 2. No evidence of acute fracture or subluxation along the cervical spine. 3. Mild cortical volume loss and scattered small vessel ischemic microangiopathy. 4. Mild degenerative change along the cervical spine. 5. Scattered calcification at the carotid bifurcations bilaterally. Carotid ultrasound would be helpful for further evaluation, when and as deemed clinically appropriate. Electronically Signed   By: Garald Balding M.D.   On: 11/20/2015 02:54   Ct Chest W Contrast  11/20/2015  CLINICAL DATA:  Elevated LFTs, transaminitis, jaundice, total bilirubin =24.2, history hypertension EXAM: CT CHEST, ABDOMEN, AND PELVIS WITH CONTRAST TECHNIQUE: Multidetector CT imaging of the chest, abdomen and pelvis was performed following the standard protocol during bolus administration of intravenous contrast. Sagittal and coronal MPR images reconstructed from axial data set. CONTRAST:  170mL ISOVUE-300 IOPAMIDOL (ISOVUE-300) INJECTION 61% IV. Dilute oral contrast. COMPARISON:  None FINDINGS: CT CHEST FINDINGS Cardiovascular: Significant atherosclerotic calcification aorta, coronary arteries and great vessels. Large dense calcified plaque at innominate artery. No aortic aneurysm or dissection. Pulmonary arteries grossly patent on nondedicated exam. Mediastinum/Lymph Nodes: Tracheobronchial cartilage calcification extending into hila. No thoracic adenopathy. Esophagus unremarkable. Lungs/Pleura: Lobulated mass with slightly spiculated margins in RIGHT lower lobe, partially chronic center, overall 2.2 x 2.2 x 2.4 cm  compatible with tumor, question primary versus metastatic. Additional nodule with slightly ill-defined margins 7 mm diameter anterior RIGHT lower lobe image 54. Questionable subpleural nodule lingula 4 mm diameter image 57. Minimal bibasilar atelectasis. No infiltrate, pleural effusion, or pneumothorax. Musculoskeletal: Calcified subcoracoid loose body RIGHT shoulder with RIGHT glenohumeral degenerative changes. Advanced glenohumeral degenerative changes with spurring and calcific debris at LEFT glenohumeral joint. Diffuse osseous demineralization. No destructive bone lesions. CT ABDOMEN PELVIS FINDINGS Hepatobiliary: Marked intrahepatic and extrahepatic biliary dilatation. CBD 3.0 cm transverse. Poorly defined low-attenuation lesion at medial RIGHT lobe liver 14 x 12 mm question metastasis. Pancreas: Poorly defined low-attenuation mass at head and uncinate process of pancreas, 3.1 x 3.3 x 3.1 cm highly suspicious for a primary pancreatic neoplasm. Marked atrophy of the pancreatic body and tail with significant pancreatic ductal dilatation up to 11 mm diameter. Mass abuts the SMA and SMV as well as anterior margin of the aorta and third portion of duodenum. Spleen: Normal appearance Adrenals/Urinary Tract: Exophytic cyst lateral RIGHT kidney 2.0 x 1.4 cm image 71. Kidneys and adrenal glands otherwise normal appearance. No hydronephrosis or hydroureter. No urinary tract calcification. Bladder well distended and unremarkable. Stomach/Bowel: Descending duodenum displaced laterally by dilated CBD. Stomach and bowel loops otherwise grossly normal appearance Vascular/Lymphatic: Extensive atherosclerotic calcifications aorta and abdominal branch vessels. Reproductive: Uterus surgically absent with nonvisualization of ovaries Other: No free air or free fluid Musculoskeletal: Degenerative disc disease changes greatest at L3-L41. No destructive bone lesions. IMPRESSION: Mass at head and uncinate process of pancreas 3.1 x 3.3  x 3.1 cm highly suspicious for a primary pancreatic neoplasm with associated  marked intrahepatic and extrahepatic biliary dilatation. Suspected RIGHT lower lobe and hepatic metastases as above. Aortic atherosclerosis and coronary arterial atherosclerotic cyst. Findings discussed with Dr. Gala Romney prior to dictation of this report on 11/20/2015. Electronically Signed   By: Lavonia Dana M.D.   On: 11/20/2015 13:21   Ct Cervical Spine Wo Contrast  11/20/2015  CLINICAL DATA:  Patient found on floor. Concern for head or cervical spine injury. Initial encounter. EXAM: CT HEAD WITHOUT CONTRAST CT CERVICAL SPINE WITHOUT CONTRAST TECHNIQUE: Multidetector CT imaging of the head and cervical spine was performed following the standard protocol without intravenous contrast. Multiplanar CT image reconstructions of the cervical spine were also generated. COMPARISON:  None. FINDINGS: CT HEAD FINDINGS There is no evidence of acute infarction, mass lesion, or intra- or extra-axial hemorrhage on CT. Prominence of the ventricles and sulci reflects mild cortical volume loss. Mild cerebellar atrophy is noted. Scattered periventricular and subcortical matter change likely reflects small vessel ischemic microangiopathy. The brainstem and fourth ventricle are within normal limits. The basal ganglia are unremarkable in appearance. The cerebral hemispheres demonstrate grossly normal gray-white differentiation. No mass effect or midline shift is seen. There is no evidence of fracture; visualized osseous structures are unremarkable in appearance. The orbits are within normal limits. The paranasal sinuses and mastoid air cells are well-aerated. No significant soft tissue abnormalities are seen. CT CERVICAL SPINE FINDINGS There is no evidence of acute fracture or subluxation. Vertebral bodies demonstrate normal height. Mild grade 1 anterolisthesis is noted of C3 on C4 and of C4 on C5. Multilevel disc space narrowing is noted along the cervical  spine, with scattered anterior and posterior disc osteophyte complexes. Prevertebral soft tissues are within normal limits. Facet disease is noted along the cervical spine. The thyroid gland is unremarkable in appearance. The visualized lung apices are clear. Scattered calcification is noted at the carotid bifurcations bilaterally. IMPRESSION: 1. No evidence of traumatic intracranial injury or fracture. 2. No evidence of acute fracture or subluxation along the cervical spine. 3. Mild cortical volume loss and scattered small vessel ischemic microangiopathy. 4. Mild degenerative change along the cervical spine. 5. Scattered calcification at the carotid bifurcations bilaterally. Carotid ultrasound would be helpful for further evaluation, when and as deemed clinically appropriate. Electronically Signed   By: Garald Balding M.D.   On: 11/20/2015 02:54   Ct Abdomen Pelvis W Contrast  11/20/2015  CLINICAL DATA:  Elevated LFTs, transaminitis, jaundice, total bilirubin =24.2, history hypertension EXAM: CT CHEST, ABDOMEN, AND PELVIS WITH CONTRAST TECHNIQUE: Multidetector CT imaging of the chest, abdomen and pelvis was performed following the standard protocol during bolus administration of intravenous contrast. Sagittal and coronal MPR images reconstructed from axial data set. CONTRAST:  115mL ISOVUE-300 IOPAMIDOL (ISOVUE-300) INJECTION 61% IV. Dilute oral contrast. COMPARISON:  None FINDINGS: CT CHEST FINDINGS Cardiovascular: Significant atherosclerotic calcification aorta, coronary arteries and great vessels. Large dense calcified plaque at innominate artery. No aortic aneurysm or dissection. Pulmonary arteries grossly patent on nondedicated exam. Mediastinum/Lymph Nodes: Tracheobronchial cartilage calcification extending into hila. No thoracic adenopathy. Esophagus unremarkable. Lungs/Pleura: Lobulated mass with slightly spiculated margins in RIGHT lower lobe, partially chronic center, overall 2.2 x 2.2 x 2.4 cm  compatible with tumor, question primary versus metastatic. Additional nodule with slightly ill-defined margins 7 mm diameter anterior RIGHT lower lobe image 54. Questionable subpleural nodule lingula 4 mm diameter image 57. Minimal bibasilar atelectasis. No infiltrate, pleural effusion, or pneumothorax. Musculoskeletal: Calcified subcoracoid loose body RIGHT shoulder with RIGHT glenohumeral degenerative changes. Advanced glenohumeral degenerative changes  with spurring and calcific debris at LEFT glenohumeral joint. Diffuse osseous demineralization. No destructive bone lesions. CT ABDOMEN PELVIS FINDINGS Hepatobiliary: Marked intrahepatic and extrahepatic biliary dilatation. CBD 3.0 cm transverse. Poorly defined low-attenuation lesion at medial RIGHT lobe liver 14 x 12 mm question metastasis. Pancreas: Poorly defined low-attenuation mass at head and uncinate process of pancreas, 3.1 x 3.3 x 3.1 cm highly suspicious for a primary pancreatic neoplasm. Marked atrophy of the pancreatic body and tail with significant pancreatic ductal dilatation up to 11 mm diameter. Mass abuts the SMA and SMV as well as anterior margin of the aorta and third portion of duodenum. Spleen: Normal appearance Adrenals/Urinary Tract: Exophytic cyst lateral RIGHT kidney 2.0 x 1.4 cm image 71. Kidneys and adrenal glands otherwise normal appearance. No hydronephrosis or hydroureter. No urinary tract calcification. Bladder well distended and unremarkable. Stomach/Bowel: Descending duodenum displaced laterally by dilated CBD. Stomach and bowel loops otherwise grossly normal appearance Vascular/Lymphatic: Extensive atherosclerotic calcifications aorta and abdominal branch vessels. Reproductive: Uterus surgically absent with nonvisualization of ovaries Other: No free air or free fluid Musculoskeletal: Degenerative disc disease changes greatest at L3-L41. No destructive bone lesions. IMPRESSION: Mass at head and uncinate process of pancreas 3.1 x 3.3  x 3.1 cm highly suspicious for a primary pancreatic neoplasm with associated marked intrahepatic and extrahepatic biliary dilatation. Suspected RIGHT lower lobe and hepatic metastases as above. Aortic atherosclerosis and coronary arterial atherosclerotic cyst. Findings discussed with Dr. Gala Romney prior to dictation of this report on 11/20/2015. Electronically Signed   By: Lavonia Dana M.D.   On: 11/20/2015 13:21   Dg Ercp Biliary & Pancreatic Ducts  11/21/2015  CLINICAL DATA:  ERCP with sphincterotomy and stent placement for pancreatic cancer EXAM: ERCP TECHNIQUE: Multiple spot images obtained with the fluoroscopic device and submitted for interpretation post-procedure. FLUOROSCOPY TIME:  Radiation Exposure Index (as provided by the fluoroscopic device): 131 mGy COMPARISON:  11/20/2015 FINDINGS: Sequential images obtained during ERCP show catheterization of the common bile duct. Contrast injection demonstrates a markedly dilated common bile duct with abrupt cut off at the expected level of the pancreatic head. On the final images metallic stent placement can be seen. IMPRESSION: 1. ERCP demonstrates common bile duct obstruction secondary to pancreatic head mass. A metallic stent is placed across the level of obstruction. These images were submitted for radiologic interpretation only. Please see the procedural report for the amount of contrast and the fluoroscopy time utilized. Electronically Signed   By: Kerby Moors M.D.   On: 11/21/2015 16:29  [2 weeks]   Assessment: 82 y/o female with large pancreatic head mass producing massive extra and intrahepatic biliary dilation. At least one foci of metastasis in right lung and possibly multiple subtle liver lesions. Patient had ERCP with covered metal stent placement yesterday. LFTs slowing coming down appropriately. Clinically patient denies vomiting, abdominal pain.   Palliative Care Consultation yesterday noted. Patient's family taking a couple of days to decide  course of action. They want to take her home. Considering in-home Hospice Care.   Plan: 1. Agree with Hospice Care given patient's advanced age and apparent metastatic disease.  2. Signing off. Please call with any questions.   Laureen Ochs. Bernarda Caffey [facility] [phone] 7/5/20179:37 AM     LOS: 2 days

## 2015-11-22 NOTE — Progress Notes (Signed)
Total Bil 20.9

## 2015-11-23 LAB — HEPATIC FUNCTION PANEL
ALK PHOS: 347 U/L — AB (ref 38–126)
ALT: 134 U/L — AB (ref 14–54)
AST: 148 U/L — ABNORMAL HIGH (ref 15–41)
Albumin: 2 g/dL — ABNORMAL LOW (ref 3.5–5.0)
BILIRUBIN DIRECT: 7.6 mg/dL — AB (ref 0.1–0.5)
BILIRUBIN INDIRECT: 6.4 mg/dL — AB (ref 0.3–0.9)
Total Bilirubin: 14 mg/dL — ABNORMAL HIGH (ref 0.3–1.2)
Total Protein: 5.7 g/dL — ABNORMAL LOW (ref 6.5–8.1)

## 2015-11-23 LAB — BASIC METABOLIC PANEL
Anion gap: 8 (ref 5–15)
BUN: 10 mg/dL (ref 6–20)
CO2: 24 mmol/L (ref 22–32)
Calcium: 6.9 mg/dL — ABNORMAL LOW (ref 8.9–10.3)
Chloride: 94 mmol/L — ABNORMAL LOW (ref 101–111)
Creatinine, Ser: 0.32 mg/dL — ABNORMAL LOW (ref 0.44–1.00)
GFR calc Af Amer: >60 mL/min (ref >60)
GLUCOSE: 118 mg/dL — AB (ref 65–99)
POTASSIUM: 4.1 mmol/L (ref 3.5–5.1)
Sodium: 126 mmol/L — ABNORMAL LOW (ref 135–145)

## 2015-11-23 LAB — CANCER ANTIGEN 19-9: CA 19 9: 86353 U/mL — AB (ref 0–35)

## 2015-11-23 MED ORDER — FUROSEMIDE 10 MG/ML IJ SOLN
20.0000 mg | Freq: Two times a day (BID) | INTRAMUSCULAR | Status: DC
  Administered 2015-11-23 – 2015-11-26 (×5): 20 mg via INTRAVENOUS
  Filled 2015-11-23 (×6): qty 2

## 2015-11-23 NOTE — Clinical Social Work Note (Signed)
CSW met with pt along with CM. Pt shared that she lives alone, but has good support from children. CSW discussed d/c plan with pt as PT recommends SNF. Pt states she does not want to go to a facility and plans to return home, but this will be a change for her as well as family will have to be with her. CM discussed with pt's daughter who confirms that if pt wants to go home they will stay with pt around the clock. CSW will sign off, but can be reconsulted if needed.   Benay Pike, [place]

## 2015-11-23 NOTE — Progress Notes (Signed)
[facility] PROGRESS NOTE  Sophia Ramirez [id number] DOB: 1925/10/24    PCP:   [name], MD   HPI:  Sophia Ramirez is an 82 y.o. 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 No other complaints. She was recommended to be placed in SNF, but decided to go home with family support.   Rewiew of Systems:  Constitutional: Negative for malaise, fever and chills. No significant weight loss or weight gain Eyes: Negative for eye pain, redness and discharge, diplopia, visual changes, or flashes of light. ENMT: Negative for ear pain, hoarseness, nasal congestion, sinus pressure and sore throat. No headaches; tinnitus, drooling, or problem swallowing. Cardiovascular: Negative for chest pain, palpitations, diaphoresis, dyspnea and peripheral edema. ; No orthopnea, PND Respiratory: Negative for cough, hemoptysis, wheezing and stridor. No pleuritic chestpain. Gastrointestinal: Negative for nausea, vomiting, diarrhea, constipation, abdominal pain, melena, blood in stool, hematemesis, jaundice and rectal bleeding.    Genitourinary: Negative for frequency, dysuria, incontinence,flank pain and hematuria; Musculoskeletal: Negative for back pain and neck pain. Negative for swelling and trauma.;  Skin: . Negative for pruritus, rash, abrasions, bruising and skin lesion.; ulcerations Neuro: Negative for headache, lightheadedness and neck stiffness. Negative for weakness, altered level of consciousness , altered mental status, extremity weakness, burning feet, involuntary movement, seizure and syncope.  Psych: negative for anxiety, depression, insomnia, tearfulness, panic attacks, hallucinations, paranoia, suicidal or homicidal ideation    Past Medical History  Diagnosis Date  . Hypertension   . Arthritis     Past Surgical History   Procedure Laterality Date  . Shoulder surgery    . Cholecystectomy    . Abdominal hysterectomy      Medications:  HOME MEDS: Prior to Admission medications   Medication Sig Start Date End Date Taking? Authorizing Provider  acetaminophen (TYLENOL) 500 MG tablet Take 500 mg by mouth every 6 (six) hours as needed for pain.   Yes Historical Provider, MD  ALPRAZolam (XANAX) 0.25 MG tablet Take 0.25 mg by mouth 3 (three) times daily.   Yes Historical Provider, MD  ramipril (ALTACE) 2.5 MG tablet Take 2.5 mg by mouth daily.   Yes Historical Provider, MD     Allergies:  No Known Allergies  Social History:   reports that she has never smoked. She does not have any smokeless tobacco history on file. She reports that she does not drink alcohol or use illicit drugs.  Family History: Family History  Problem Relation Age of Onset  . Colon cancer      unknown     Physical Exam: Filed Vitals:   11/22/15 1348 11/22/15 2028 11/23/15 0523 11/23/15 0900  BP: 131/59 154/62 153/74 143/99  Pulse: 72 82 63 75  Temp: 99.4 F (37.4 C) 97.7 F (36.5 C) 98.9 F (37.2 C) 98.2 F (36.8 C)  TempSrc: Oral Axillary Oral Oral  Resp: 20 20 20 20   Height:      Weight:      SpO2: 100% 94% 97% 100%   Blood pressure 143/99, pulse 75, temperature 98.2 F (36.8 C), temperature source Oral, resp. rate 20, height 5\' 5"  (1.651 m), weight 62.596 kg (138 lb), SpO2 100 %.  GEN:  Pleasant patient lying in the stretcher in no acute distress; cooperative with exam. PSYCH:  alert and oriented x4; does not appear anxious or depressed; affect is appropriate. HEENT: Mucous membranes  pink and anicteric; PERRLA; EOM intact; no cervical lymphadenopathy nor thyromegaly or carotid bruit; no JVD; There were no stridor. Neck is very supple. Breasts:: Not examined CHEST WALL: No tenderness CHEST: Normal respiration, clear to auscultation bilaterally.  HEART: Regular rate and rhythm.  There are no murmur, rub, or  gallops.   BACK: No kyphosis or scoliosis; no CVA tenderness ABDOMEN: soft and non-tender; no masses, no organomegaly, normal abdominal bowel sounds; no pannus; no intertriginous candida. There is no rebound and no distention. Rectal Exam: Not done EXTREMITIES: No bone or joint deformity; age-appropriate arthropathy of the hands and knees; no edema; no ulcerations.  There is no calf tenderness. Genitalia: not examined PULSES: 2+ and symmetric SKIN: Normal hydration no rash or ulceration CNS: Cranial nerves 2-12 grossly intact no focal lateralizing neurologic deficit.  Speech is fluent; uvula elevated with phonation, facial symmetry and tongue midline. DTR are normal bilaterally, cerebella exam is intact, barbinski is negative and strengths are equaled bilaterally.  No sensory loss.   Labs on Admission:  Basic Metabolic Panel:  Recent Labs Lab 11/20/15 0548 11/20/15 0905 11/21/15 0537 11/22/15 0523 11/23/15 0510  NA 125* 123* 125* 125* 126*  K 3.1* 3.2* 3.3* 3.9 4.1  CL 85* 85* 90* 91* 94*  CO2 27 26 26 26 24   GLUCOSE 109* 112* 94 99 118*  BUN 17 15 11 11 10   CREATININE <0.30* <0.30* <0.30* <0.30* 0.32*  CALCIUM 8.0* 7.9* 7.4* 7.1* 6.9*   Liver Function Tests:  Recent Labs Lab 11/20/15 0110 11/20/15 0548 11/21/15 0537 11/22/15 0523 11/23/15 0510  AST 245* 212* 212* 223* 148*  ALT 172* 153* 147* 156* 134*  ALKPHOS 454* 406* 401* 387* 347*  BILITOT 27.7* 24.2* 23.7* 20.9* 14.0*  PROT 7.4 6.6 6.1* 5.9* 5.7*  ALBUMIN 2.9* 2.5* 2.2* 2.1* 2.0*    Recent Labs Lab 11/20/15 0110  LIPASE 30    Recent Labs Lab 11/20/15 0200  AMMONIA 33   CBC:  Recent Labs Lab 11/20/15 0110 11/20/15 0548 11/21/15 0537 11/22/15 0523  WBC 10.2 9.2 9.0 10.4  NEUTROABS 8.5*  --   --   --   HGB 11.1* 10.1* 9.7* 9.5*  HCT 30.9* 28.3* 27.4* 27.2*  MCV 80.3 80.2 80.1 80.2  PLT 447* 399 426* 427*   Cardiac Enzymes:  Recent Labs Lab 11/20/15 0110 11/20/15 0548 11/20/15 1311  11/20/15 1857  CKTOTAL 222  --   --   --   TROPONINI 0.04* 0.05* 0.05* 0.06*    CBG: No results for input(s): GLUCAP in the last 168 hours.   Radiological Exams on Admission: Dg Ercp Biliary & Pancreatic Ducts  11/21/2015  CLINICAL DATA:  ERCP with sphincterotomy and stent placement for pancreatic cancer EXAM: ERCP TECHNIQUE: Multiple spot images obtained with the fluoroscopic device and submitted for interpretation post-procedure. FLUOROSCOPY TIME:  Radiation Exposure Index (as provided by the fluoroscopic device): 131 mGy COMPARISON:  11/20/2015 FINDINGS: Sequential images obtained during ERCP show catheterization of the common bile duct. Contrast injection demonstrates a markedly dilated common bile duct with abrupt cut off at the expected level of the pancreatic head. On the final images metallic stent placement can be seen. IMPRESSION: 1. ERCP demonstrates common bile duct obstruction secondary to pancreatic head mass. A metallic stent is placed across the level of obstruction. These images were submitted for radiologic interpretation only. Please see the procedural report for the amount of contrast and the fluoroscopy time utilized. Electronically Signed   By: Queen Slough.D.  On: 11/21/2015 16:29    Assessment/Plan 1. Hyponatremia- likely from dehydration/SIADH due to lung mets , continue normal saline at 100 mL per hour.Correction is slow.  Will give IV Lasix to get rid of free water.  TSH nl , cortisol 27.6 , CT of the chest shows spiculated mass in the right lower lobe. Will restrict fluid to about 1200cc/day.  It is still at 126 today.  2. Obstructive jaundice/pancreatic cancer- patient has transaminitis with elevated AST, ALT, alkaline phosphatase, bilirubin elevated to 23.7, now coming down to 14.  She had seen palliative care medicine, and is thinking about her GOC. Currently still full code. She is tolerating food OK.  3. Hypertension- continue Altace ,  verapamil. 4. Elevated troponin- troponin is 0.04, total CK is 222.  5. Fever-suspect tumor fever , white count normal, doubt cholangitis  Other plans as per orders. Code Status:FULL CODE.    Orvan Falconer, MD.  FACP [facility] Pager [phone] 7pm to 7am.  11/23/2015, 12:46 PM

## 2015-11-23 NOTE — Progress Notes (Signed)
PT Cancellation Note  Patient Details Name: Sophia Ramirez MRN: [id number] DOB: 1925-12-17   Cancelled Treatment:    Reason Eval/Treat Not Completed: Patient not medically ready (Chart reviewed, RN consulted. Pt Na+ level is outside of safe range for exertional activities per [facility] policy. Will continue to monitor and attempt treatment again at later date/time as appropriate. )   11:29 AM, 11/23/2015 Etta Grandchild, PT, DPT Physical Therapist - [place] [phone] (mobile)

## 2015-11-23 NOTE — Care Management Note (Signed)
Case Management Note  Patient Details  Name: Sophia Ramirez MRN: [id number] Date of Birth: 06-13-1925  Subjective/Objective:                  Pt admitted with hyponatremia and has since found to have pancreatic cancer. Pt is from home, lives alone PTA. Pt has walker to use with ambulation. PT has recommended SNF at DC. Pt wants to go home, pt's son and DIL check in on her frequently during the day but did not have 24/7 supervision. With pt's permission CM called daughter Sophia Ramirez and discussed DC plan. Sophia Ramirez does not want to make any decisions until brother returns from vacation on Monday. Sophia Ramirez states if her mom wants to go home, they will take her home with [facility] services. Sophia Ramirez states they will be able to provide 24/7 supervision at DC. List of [place] agencies left at bedside for pt and Sophia Ramirez to review today and choose [facility] agency for [facility] PT.   Action/Plan: Will cont to follow for DC planning.   Expected Discharge Date:    10/27/2015              Expected Discharge Plan:  [place]  In-House Referral:  NA  Discharge planning Services  CM Consult  Post Acute Care Choice:  Home Health Choice offered to:  Patient, Adult Children  DME Arranged:    DME Agency:     HH Arranged:  RN, PT, Social Work [employer] Agency:     Status of Service:  In process, will continue to follow  If discussed at Long Length of Stay Meetings, dates discussed:    Additional Comments:  Sherald Barge, RN 11/23/2015, 9:34 AM

## 2015-11-24 LAB — HEPATIC FUNCTION PANEL
ALBUMIN: 2.3 g/dL — AB (ref 3.5–5.0)
ALK PHOS: 340 U/L — AB (ref 38–126)
ALT: 122 U/L — AB (ref 14–54)
AST: 129 U/L — AB (ref 15–41)
BILIRUBIN TOTAL: 12.2 mg/dL — AB (ref 0.3–1.2)
Bilirubin, Direct: 6.5 mg/dL — ABNORMAL HIGH (ref 0.1–0.5)
Indirect Bilirubin: 5.7 mg/dL — ABNORMAL HIGH (ref 0.3–0.9)
Total Protein: 6.2 g/dL — ABNORMAL LOW (ref 6.5–8.1)

## 2015-11-24 LAB — BASIC METABOLIC PANEL
Anion gap: 7 (ref 5–15)
BUN: 10 mg/dL (ref 6–20)
CHLORIDE: 92 mmol/L — AB (ref 101–111)
CO2: 28 mmol/L (ref 22–32)
CREATININE: 0.56 mg/dL (ref 0.44–1.00)
Calcium: 7.1 mg/dL — ABNORMAL LOW (ref 8.9–10.3)
GFR calc non Af Amer: >60 mL/min (ref >60)
Glucose, Bld: 115 mg/dL — ABNORMAL HIGH (ref 65–99)
POTASSIUM: 4 mmol/L (ref 3.5–5.1)
Sodium: 127 mmol/L — ABNORMAL LOW (ref 135–145)

## 2015-11-24 MED ORDER — ENSURE ENLIVE PO LIQD
237.0000 mL | Freq: Two times a day (BID) | ORAL | Status: DC
  Administered 2015-11-25 – 2015-11-26 (×3): 237 mL via ORAL

## 2015-11-24 NOTE — Progress Notes (Signed)
Physical Therapy Treatment Patient Details Name: KLA MCCLARREN MRN: [id number] DOB: [birth year] Today's Date: 11/24/2015    History of Present Illness Guelda Stankus is a 82 y.o. female, With history of hypertension, status post cholecystectomy who was brought to the ED after patient was found on the floor. As per daughter patient was underfilled both third 49 hours. She was last seen on Saturday night. Patient stated that she was going to get up from the air conditioning off in her room and stripped out of the bed onto the floor. Family said they were able to get off the floor and put her back in bed but she has not ambulated.    PT Comments    Pt continues to want to go home when therapist mentions a SNF.  Pt able to sit at side of bed for 5 minutes prior to fatigue today.  Pt unable to transfer more due to fear of falling.    Follow Up Recommendations  Home health PT     Equipment Recommendations    none   Recommendations for Other Services  none     Precautions / Restrictions Precautions Precautions: Fall    Mobility  Bed Mobility Overal bed mobility: Needs Assistance Bed Mobility: Supine to Sit     Supine to sit: Min assist Sit to supine: Min assist      Transfers Overall transfer level: Needs assistance   Transfers: Sit to/from Stand Sit to Stand: Max assist            Ambulation/Gait    unable              Stairs            Wheelchair Mobility    Modified Rankin (Stroke Patients Only)       Balance                                    Cognition Arousal/Alertness: Awake/alert Behavior During Therapy: WFL for tasks assessed/performed                        Exercises General Exercises - Lower Extremity Ankle Circles/Pumps: Both;10 reps Quad Sets: Both;10 reps Gluteal Sets: Both;10 reps Heel Slides: Both        Pertinent Vitals/Pain Pain Assessment: No/denies pain    Home Living                       Prior Function            PT Goals (current goals can now be found in the care plan section) Acute Rehab PT Goals PT Goal Formulation: With patient Progress towards PT goals: Progressing toward goals    Frequency  Min 4X/week    PT Plan Current plan remains appropriate       End of Session Equipment Utilized During Treatment: Gait belt Activity Tolerance: Patient limited by fatigue Patient left: in bed;with call bell/phone within reach;with bed alarm set     Time: [id number] PT Time Calculation (min) (ACUTE ONLY): 26 min  Charges:  $Therapeutic Exercise: 8-22 mins $Therapeutic Activity: 8-22 mins                    G CodesRayetta Humphrey, PT CLT [phone] 11/24/2015, 10:34 AM

## 2015-11-24 NOTE — Care Management Note (Signed)
Case Management Note  Patient Details  Name: Sophia Ramirez MRN: [id number] Date of Birth: [birth year] Expected Discharge Date:          11/27/2015        Expected Discharge Plan:  [place]  In-House Referral:  NA  Discharge planning Services  CM Consult  Post Acute Care Choice:  Home Health Choice offered to:  Patient, Adult Children  DME Arranged:    DME Agency:     HH Arranged:  RN, PT, Social Work [place] Agency:     Status of Service:  In process, will continue to follow  If discussed at Long Length of Stay Meetings, dates discussed:    Additional Comments: I have spoken with daughter, Sophia Ramirez who wants patient to go to SNF or have [place]. Patient is alert and oriented, refuses both. Daughter has been made aware. If patient changes her mind about HH, an agency needs to be chosen. Daughter has list of agencies currently.   Carmelina Balducci, Chauncey [place], RN 11/24/2015, 2:00 PM

## 2015-11-24 NOTE — Care Management Important Message (Signed)
Important Message  Patient Details  Name: Sophia Ramirez MRN: [id number] Date of Birth: [birth year]   Medicare Important Message Given:  Yes    Diann Bangerter, Chauncey [place], RN 11/24/2015, 9:49 AM

## 2015-11-24 NOTE — NC FL2 (Deleted)
[place] LEVEL OF CARE SCREENING TOOL     IDENTIFICATION  Patient Name: Sophia Ramirez Birthdate: 05-Jul-1925 Sex: female Admission Date (Current Location): 11/20/2015  [facility] and [place] Number:  Engineer, manufacturing systems and Address:  [facility],  [place], [place]      Provider Number: [id number]  Attending Physician Name and Address:  Orvan Falconer, MD  Relative Name and Phone Number:       Current Level of Care: Hospital Recommended Level of Care: [place] Prior Approval Number:    Date Approved/Denied:   PASRR Number:  ([id number])  Discharge Plan: SNF    Current Diagnoses: Patient Active Problem List   Diagnosis Date Noted  . Transaminitis   . Fall   . Hyperbilirubinemia   . Pancreatic cancer ([place])   . Palliative care encounter   . DNR (do not resuscitate) discussion   . Goals of care, counseling/discussion   . Hyponatremia 11/20/2015  . Elevated LFTs   . GERD 01/13/2008  . RENAL CYST 01/13/2008  . NAUSEA WITH VOMITING 01/13/2008  . ABDOMINAL PAIN, UNSPECIFIED SITE 01/13/2008  . CONSTIPATION, HX OF 01/13/2008  . HYPERTENSION 12/23/2007  . 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 WEIGHT LOSS 12/23/2007  . DYSPHAGIA UNSPECIFIED 12/23/2007    Orientation RESPIRATION BLADDER Height & Weight     Self, Situation, Place  Normal Continent Weight: 138 lb (62.596 kg) Height:  5\' 5"  (165.1 cm)  BEHAVIORAL SYMPTOMS/MOOD NEUROLOGICAL BOWEL NUTRITION STATUS      Continent Diet (Heart Healthy. Fluid Restriction 1200 mL fluid)  AMBULATORY STATUS COMMUNICATION OF NEEDS Skin   Limited Assist Verbally Normal                       Personal Care Assistance Level of Assistance  Bathing, Dressing Bathing Assistance: Limited assistance   Dressing Assistance: Limited assistance     Functional Limitations Info  Sight, Hearing, Speech Sight Info: Adequate Hearing Info: Adequate Speech Info: Adequate    SPECIAL  CARE FACTORS FREQUENCY        PT Frequency: 4x/week              Contractures      Additional Factors Info  Code Status, Allergies, Psychotropic Code Status Info: Full Code Allergies Info: NKA Psychotropic Info: Xanax         Current Medications (11/24/2015):  This is the current hospital active medication list Current Facility-Administered Medications  Medication Dose Route Frequency Provider Last Rate Last Dose  . 0.9 % NaCl with KCl 40 mEq / L  infusion   Intravenous Continuous Reyne Dumas, MD 100 mL/hr at 11/24/15 1104 100 mL/hr at 11/24/15 1104  . acetaminophen (TYLENOL) tablet 325 mg  325 mg Oral Q6H PRN Rogene Houston, MD      . ALPRAZolam Duanne Moron) tablet 0.25 mg  0.25 mg Oral TID Oswald Hillock, MD   0.25 mg at 11/24/15 1007  . furosemide (LASIX) injection 20 mg  20 mg Intravenous BID Orvan Falconer, MD   20 mg at 11/24/15 0820  . pantoprazole (PROTONIX) EC tablet 40 mg  40 mg Oral Daily Rogene Houston, MD   40 mg at 11/24/15 1007  . ramipril (ALTACE) capsule 2.5 mg  2.5 mg Oral Daily Oswald Hillock, MD   2.5 mg at 11/24/15 1007  . sodium chloride flush (NS) 0.9 % injection 3 mL  3 mL Intravenous Q12H Oswald Hillock, MD  3 mL at 11/24/15 1000  . verapamil (CALAN-SR) CR tablet 240 mg  240 mg Oral Daily Oswald Hillock, MD   240 mg at 11/24/15 1007     Discharge Medications: Please see discharge summary for a list of discharge medications.  Relevant Imaging Results:  Relevant Lab Results:   Additional Information SSN: [id number], Clydene Pugh, LCSW

## 2015-11-24 NOTE — Clinical Social Work Note (Signed)
CSW met with pt again due to concerns from children about pt going home. Pt clearly remembers conversation yesterday with CSW and CM regarding d/c planning. CSW encouraged pt again to consider SNF, but she adamantly refuses. Her daughter-in-law was also in room with permission and agreed that SNF would be best option. Pt indicated that she was not interested in discussing any longer and states, "You know what you can do? You can leave." CSW validated pt's right to self determination. Her daughter is aware of decision and was not surprised that pt refused SNF and home health. Will sign off.   Benay Pike, [place]

## 2015-11-24 NOTE — Progress Notes (Signed)
[facility] PROGRESS NOTE  Sophia Ramirez [id number] DOB: 06-18-1925    PCP:   [name], MD   HPI:  Sophia Ramirez is an 82 y.o. 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  She has no abdominal pain, and her total bili is improving. No other complaints. She was recommended to be placed in SNF, but decided to go home with family support. Her LFTs have steadily improved.  She has some confusion this am.   Rewiew of Systems:  Constitutional: Negative for malaise, fever and chills. No significant weight loss or weight gain Eyes: Negative for eye pain, redness and discharge, diplopia, visual changes, or flashes of light. ENMT: Negative for ear pain, hoarseness, nasal congestion, sinus pressure and sore throat. No headaches; tinnitus, drooling, or problem swallowing. Cardiovascular: Negative for chest pain, palpitations, diaphoresis, dyspnea and peripheral edema. ; No orthopnea, PND Respiratory: Negative for cough, hemoptysis, wheezing and stridor. No pleuritic chestpain. Gastrointestinal: Negative for nausea, vomiting, diarrhea, constipation, abdominal pain, melena, blood in stool, hematemesis, jaundice and rectal bleeding.    Genitourinary: Negative for frequency, dysuria, incontinence,flank pain and hematuria; Musculoskeletal: Negative for back pain and neck pain. Negative for swelling and trauma.;  Skin: . Negative for pruritus, rash, abrasions, bruising and skin lesion.; ulcerations Neuro: Negative for headache, lightheadedness and neck stiffness. Negative for weakness, altered level of consciousness , altered mental status, extremity weakness, burning feet, involuntary movement, seizure and syncope.  Psych: negative for anxiety, depression, insomnia, tearfulness, panic attacks, hallucinations, paranoia, suicidal or homicidal ideation    Past Medical History  Diagnosis Date  .  Hypertension   . Arthritis     Past Surgical History  Procedure Laterality Date  . Shoulder surgery    . Cholecystectomy    . Abdominal hysterectomy      Medications:  HOME MEDS: Prior to Admission medications   Medication Sig Start Date End Date Taking? Authorizing Provider  acetaminophen (TYLENOL) 500 MG tablet Take 500 mg by mouth every 6 (six) hours as needed for pain.   Yes Historical Provider, MD  ALPRAZolam (XANAX) 0.25 MG tablet Take 0.25 mg by mouth 3 (three) times daily.   Yes Historical Provider, MD  ramipril (ALTACE) 2.5 MG tablet Take 2.5 mg by mouth daily.   Yes Historical Provider, MD     Allergies:  No Known Allergies  Social History:   reports that she has never smoked. She does not have any smokeless tobacco history on file. She reports that she does not drink alcohol or use illicit drugs.  Family History: Family History  Problem Relation Age of Onset  . Colon cancer      unknown     Physical Exam: Filed Vitals:   11/23/15 0900 11/23/15 1412 11/23/15 2103 11/24/15 0425  BP: 143/99 122/56 121/72 124/62  Pulse: 75 91 83 82  Temp: 98.2 F (36.8 C) 98.4 F (36.9 C) 98.6 F (37 C) 98.5 F (36.9 C)  TempSrc: Oral Oral Oral Oral  Resp: 20 20 20 20   Height:      Weight:      SpO2: 100% 99% 98% 99%   Blood pressure 124/62, pulse 82, temperature 98.5 F (36.9 C), temperature source Oral, resp. rate 20, height 5\' 5"  (1.651 m), weight 62.596 kg (138 lb), SpO2 99 %.  GEN:  Pleasant  patient lying in the stretcher in no acute distress; cooperative with exam. PSYCH:  alert and  oriented x4; does not appear anxious or depressed; affect is appropriate. HEENT: Mucous membranes pink and anicteric; PERRLA; EOM intact; no cervical lymphadenopathy nor thyromegaly or carotid bruit; no JVD; There were no stridor. Neck is very supple. Breasts:: Not examined CHEST WALL: No tenderness CHEST: Normal respiration, clear to auscultation bilaterally.  HEART: Regular rate  and rhythm.  There are no murmur, rub, or gallops.   BACK: No kyphosis or scoliosis; no CVA tenderness ABDOMEN: soft and non-tender; no masses, no organomegaly, normal abdominal bowel sounds; no pannus; no intertriginous candida. There is no rebound and no distention. Rectal Exam: Not done EXTREMITIES: No bone or joint deformity; age-appropriate arthropathy of the hands and knees; no edema; no ulcerations.  There is no calf tenderness. Genitalia: not examined PULSES: 2+ and symmetric SKIN: Normal hydration no rash or ulceration CNS: Cranial nerves 2-12 grossly intact no focal lateralizing neurologic deficit.  Speech is fluent; uvula elevated with phonation, facial symmetry and tongue midline. DTR are normal bilaterally, cerebella exam is intact, barbinski is negative and strengths are equaled bilaterally.  No sensory loss.   Labs on Admission:  Basic Metabolic Panel:  Recent Labs Lab 11/20/15 0905 11/21/15 0537 11/22/15 0523 11/23/15 0510 11/24/15 0547  NA 123* 125* 125* 126* 127*  K 3.2* 3.3* 3.9 4.1 4.0  CL 85* 90* 91* 94* 92*  CO2 26 26 26 24 28   GLUCOSE 112* 94 99 118* 115*  BUN 15 11 11 10 10   CREATININE <0.30* <0.30* <0.30* 0.32* 0.56  CALCIUM 7.9* 7.4* 7.1* 6.9* 7.1*   Liver Function Tests:  Recent Labs Lab 11/20/15 0548 11/21/15 0537 11/22/15 0523 11/23/15 0510 11/24/15 0547  AST 212* 212* 223* 148* 129*  ALT 153* 147* 156* 134* 122*  ALKPHOS 406* 401* 387* 347* 340*  BILITOT 24.2* 23.7* 20.9* 14.0* 12.2*  PROT 6.6 6.1* 5.9* 5.7* 6.2*  ALBUMIN 2.5* 2.2* 2.1* 2.0* 2.3*    Recent Labs Lab 11/20/15 0110  LIPASE 30    Recent Labs Lab 11/20/15 0200  AMMONIA 33   CBC:  Recent Labs Lab 11/20/15 0110 11/20/15 0548 11/21/15 0537 11/22/15 0523  WBC 10.2 9.2 9.0 10.4  NEUTROABS 8.5*  --   --   --   HGB 11.1* 10.1* 9.7* 9.5*  HCT 30.9* 28.3* 27.4* 27.2*  MCV 80.3 80.2 80.1 80.2  PLT 447* 399 426* 427*   Cardiac Enzymes:  Recent Labs Lab  11/20/15 0110 11/20/15 0548 11/20/15 1311 11/20/15 1857  CKTOTAL 222  --   --   --   TROPONINI 0.04* 0.05* 0.05* 0.06*   Assessment/Plan Present on Admission:  . Hyponatremia Biliary obstruction. Suspicious for pancreatic malignancy.  PLAN:  Hyponatremia:  Likely SIADH, will continue with free water restriction, continue with IVF and NaCl IVF. Biliary obstruction:  S/p ERCP with stent placed.  Improving, and LFTs have been coming down.  HTN:  Continue meds. Elevated troponin:  Not ACS.  Stable.   Other plans as per orders. Code Status: FULL Haskel Khan, MD.  FACP [facility] Pager [phone] 7pm to 7am.  11/24/2015, 12:12 PM

## 2015-11-25 DIAGNOSIS — R079 Chest pain, unspecified: Secondary | ICD-10-CM

## 2015-11-25 LAB — BASIC METABOLIC PANEL
Anion gap: 9 (ref 5–15)
BUN: 13 mg/dL (ref 6–20)
CHLORIDE: 93 mmol/L — AB (ref 101–111)
CO2: 28 mmol/L (ref 22–32)
Calcium: 7.2 mg/dL — ABNORMAL LOW (ref 8.9–10.3)
Creatinine, Ser: 0.68 mg/dL (ref 0.44–1.00)
GFR calc Af Amer: >60 mL/min (ref >60)
GFR calc non Af Amer: >60 mL/min (ref >60)
Glucose, Bld: 104 mg/dL — ABNORMAL HIGH (ref 65–99)
POTASSIUM: 3.9 mmol/L (ref 3.5–5.1)
SODIUM: 130 mmol/L — AB (ref 135–145)

## 2015-11-25 LAB — HEPATIC FUNCTION PANEL
ALBUMIN: 2.2 g/dL — AB (ref 3.5–5.0)
ALK PHOS: 309 U/L — AB (ref 38–126)
ALT: 112 U/L — AB (ref 14–54)
AST: 121 U/L — AB (ref 15–41)
BILIRUBIN TOTAL: 9.7 mg/dL — AB (ref 0.3–1.2)
Bilirubin, Direct: 5 mg/dL — ABNORMAL HIGH (ref 0.1–0.5)
Indirect Bilirubin: 4.7 mg/dL — ABNORMAL HIGH (ref 0.3–0.9)
Total Protein: 6 g/dL — ABNORMAL LOW (ref 6.5–8.1)

## 2015-11-25 MED ORDER — VERAPAMIL HCL ER 240 MG PO TBCR: 240.0000 mg | EXTENDED_RELEASE_TABLET | Freq: Every day | ORAL | Status: DC

## 2015-11-25 MED ORDER — FUROSEMIDE 10 MG/ML IJ SOLN: 20.0000 mg | Freq: Two times a day (BID) | INTRAMUSCULAR | Status: DC

## 2015-11-25 NOTE — Discharge Summary (Signed)
Physician Discharge Summary  Sophia Ramirez KDT:[id number] DOB: [birth year] DOA: 11/20/2015  PCP: [name], MD  Admit date: 11/20/2015 Discharge date: 11/25/2015  Time spent: 35 minutes  Recommendations for Outpatient Follow-up:  1. Follow up with PCP next week.  2. Follow up with Dr Dereck Leep as scheduled.     Discharge Diagnoses:  Active Problems:   Hyponatremia   Elevated LFTs   Fall   Hyperbilirubinemia   Pancreatic cancer [facility])   Palliative care encounter   DNR (do not resuscitate) discussion   Goals of care, counseling/discussion   Transaminitis   Discharge Condition: improved.  No N/V, able to eat, improved total bilirubin and LFTs.   Na of 130.  Diet recommendation: as tolerated.   Filed Weights   11/20/15 0114  Weight: 62.596 kg (138 lb)    History of present illness: patient was admitted into the hospital by Dr Darrick Meigs on July 3rd, 2017.  As per his H and P: " Sophia Ramirez is a 82 y.o. female, With history of hypertension, status post cholecystectomy who was brought to the ED after patient was found on the floor. As per daughter patient was underfilled both third 49 hours. She was last seen on Saturday night. Patient stated that she was going to get up from the air conditioning off in her room and stripped out of the bed onto the floor. Family said they were able to get off the floor and put her back in bed but she has not ambulated. Patient denies pain at this time. There is no history of any injury. Patient skin is yellow, with severe jaundice. Family says that she has gotten worse over the past few days. No history of nausea vomiting. No diarrhea. No chest pain or shortness of breath. No history of alcohol abuse denies history of hepatitis. Lab work in the ED revealed hyponatremia, transaminitis and total bilirubin elevated 27.7   Hospital Course: Patient had biliary obstruction, and it was felt that she has pancreatic malignancy, though it was not proven.  GI  consultation was done, and Dr Dereck Leep proceeded with ERCP with stent placement in the proper location.  She subsequently has improvement of her total bilirubin, going from 27 on admission down to 12.  Her hepatic fx tests improved as well as expected.  Patient and her family were updated that it was suspicious she has metastatic pancreatitc cancer with mets to the right lung and possibly in the liver as well.  Palliative consultation was requested, and PMT saw her, and her current status is that she is a full code.  There will be a need to continue with further discussing her GOC.  She is stable at this time.  Her low NA of 126 had kept her from being discharged, though her biliary obstruction improved with the stent.  She was able to advance her diet without any problem.  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  With that, she was placed on water restriction of 1200-1500cc per day.  The Na rose slowly, and she was then placed on NS IVF with IV Lasix.  After several days, her Na rose to 130, and she felt well.  She is stable for discharge today, and will be discharged to home with PCP follow up in one week.  I would like her to see Dr Dereck Leep as well to see if there is any further recommendation for her.  Thank you so much and Good  Day.    Procedures:  ERCP with biliary stent placement.   Consultations:  Palliative Care Medicine consultation  GI consultation.   Discharge Exam: Filed Vitals:   11/25/15 0500 11/25/15 1152  BP: 132/65 138/74  Pulse: 75   Temp: 98.4 F (36.9 C)   Resp: 18     Discharge Instructions   Discharge Instructions    Diet - low sodium heart healthy    Complete by:  As directed      Increase activity slowly    Complete by:  As directed           Current Discharge Medication List    START taking these medications   Details  furosemide (LASIX) 10 MG/ML injection Inject 2 mLs (20 mg total) into the vein 2 (two) times daily. Qty:  4 mL, Refills: 0    verapamil (CALAN-SR) 240 MG CR tablet Take 1 tablet (240 mg total) by mouth daily. Qty: 30 tablet, Refills: 1      CONTINUE these medications which have NOT CHANGED   Details  acetaminophen (TYLENOL) 500 MG tablet Take 500 mg by mouth every 6 (six) hours as needed for pain.    ALPRAZolam (XANAX) 0.25 MG tablet Take 0.25 mg by mouth 3 (three) times daily.    ramipril (ALTACE) 2.5 MG tablet Take 2.5 mg by mouth daily.       No Known Allergies    The results of significant diagnostics from this hospitalization (including imaging, microbiology, ancillary and laboratory) are listed below for reference.    Significant Diagnostic Studies: Dg Chest 1 View  11/20/2015  CLINICAL DATA:  Patient found on floor.  Initial encounter. EXAM: CHEST 1 VIEW COMPARISON:  Chest radiograph performed 07/12/2006 FINDINGS: The lungs are well-aerated and clear. There is no evidence of focal opacification, pleural effusion or pneumothorax. The cardiomediastinal silhouette is borderline normal in size. No acute osseous abnormalities are seen. Mild degenerative change is noted at the left glenohumeral joint. IMPRESSION: No acute cardiopulmonary process seen. Electronically Signed   By: Garald Balding M.D.   On: 11/20/2015 02:46   Ct Head Wo Contrast  11/20/2015  CLINICAL DATA:  Patient found on floor. Concern for head or cervical spine injury. Initial encounter. EXAM: CT HEAD WITHOUT CONTRAST CT CERVICAL SPINE WITHOUT CONTRAST TECHNIQUE: Multidetector CT imaging of the head and cervical spine was performed following the standard protocol without intravenous contrast. Multiplanar CT image reconstructions of the cervical spine were also generated. COMPARISON:  None. FINDINGS: CT HEAD FINDINGS There is no evidence of acute infarction, mass lesion, or intra- or extra-axial hemorrhage on CT. Prominence of the ventricles and sulci reflects mild cortical volume loss. Mild cerebellar atrophy is noted.  Scattered periventricular and subcortical matter change likely reflects small vessel ischemic microangiopathy. The brainstem and fourth ventricle are within normal limits. The basal ganglia are unremarkable in appearance. The cerebral hemispheres demonstrate grossly normal gray-white differentiation. No mass effect or midline shift is seen. There is no evidence of fracture; visualized osseous structures are unremarkable in appearance. The orbits are within normal limits. The paranasal sinuses and mastoid air cells are well-aerated. No significant soft tissue abnormalities are seen. CT CERVICAL SPINE FINDINGS There is no evidence of acute fracture or subluxation. Vertebral bodies demonstrate normal height. Mild grade 1 anterolisthesis is noted of C3 on C4 and of C4 on C5. Multilevel disc space narrowing is noted along the cervical spine, with scattered anterior and posterior disc osteophyte complexes. Prevertebral soft tissues are within normal limits.  Facet disease is noted along the cervical spine. The thyroid gland is unremarkable in appearance. The visualized lung apices are clear. Scattered calcification is noted at the carotid bifurcations bilaterally. IMPRESSION: 1. No evidence of traumatic intracranial injury or fracture. 2. No evidence of acute fracture or subluxation along the cervical spine. 3. Mild cortical volume loss and scattered small vessel ischemic microangiopathy. 4. Mild degenerative change along the cervical spine. 5. Scattered calcification at the carotid bifurcations bilaterally. Carotid ultrasound would be helpful for further evaluation, when and as deemed clinically appropriate. Electronically Signed   By: Garald Balding M.D.   On: 11/20/2015 02:54   Ct Chest W Contrast  11/20/2015  CLINICAL DATA:  Elevated LFTs, transaminitis, jaundice, total bilirubin =24.2, history hypertension EXAM: CT CHEST, ABDOMEN, AND PELVIS WITH CONTRAST TECHNIQUE: Multidetector CT imaging of the chest, abdomen and  pelvis was performed following the standard protocol during bolus administration of intravenous contrast. Sagittal and coronal MPR images reconstructed from axial data set. CONTRAST:  112m ISOVUE-300 IOPAMIDOL (ISOVUE-300) INJECTION 61% IV. Dilute oral contrast. COMPARISON:  None FINDINGS: CT CHEST FINDINGS Cardiovascular: Significant atherosclerotic calcification aorta, coronary arteries and great vessels. Large dense calcified plaque at innominate artery. No aortic aneurysm or dissection. Pulmonary arteries grossly patent on nondedicated exam. Mediastinum/Lymph Nodes: Tracheobronchial cartilage calcification extending into hila. No thoracic adenopathy. Esophagus unremarkable. Lungs/Pleura: Lobulated mass with slightly spiculated margins in RIGHT lower lobe, partially chronic center, overall 2.2 x 2.2 x 2.4 cm compatible with tumor, question primary versus metastatic. Additional nodule with slightly ill-defined margins 7 mm diameter anterior RIGHT lower lobe image 54. Questionable subpleural nodule lingula 4 mm diameter image 57. Minimal bibasilar atelectasis. No infiltrate, pleural effusion, or pneumothorax. Musculoskeletal: Calcified subcoracoid loose body RIGHT shoulder with RIGHT glenohumeral degenerative changes. Advanced glenohumeral degenerative changes with spurring and calcific debris at LEFT glenohumeral joint. Diffuse osseous demineralization. No destructive bone lesions. CT ABDOMEN PELVIS FINDINGS Hepatobiliary: Marked intrahepatic and extrahepatic biliary dilatation. CBD 3.0 cm transverse. Poorly defined low-attenuation lesion at medial RIGHT lobe liver 14 x 12 mm question metastasis. Pancreas: Poorly defined low-attenuation mass at head and uncinate process of pancreas, 3.1 x 3.3 x 3.1 cm highly suspicious for a primary pancreatic neoplasm. Marked atrophy of the pancreatic body and tail with significant pancreatic ductal dilatation up to 11 mm diameter. Mass abuts the SMA and SMV as well as anterior  margin of the aorta and third portion of duodenum. Spleen: Normal appearance Adrenals/Urinary Tract: Exophytic cyst lateral RIGHT kidney 2.0 x 1.4 cm image 71. Kidneys and adrenal glands otherwise normal appearance. No hydronephrosis or hydroureter. No urinary tract calcification. Bladder well distended and unremarkable. Stomach/Bowel: Descending duodenum displaced laterally by dilated CBD. Stomach and bowel loops otherwise grossly normal appearance Vascular/Lymphatic: Extensive atherosclerotic calcifications aorta and abdominal branch vessels. Reproductive: Uterus surgically absent with nonvisualization of ovaries Other: No free air or free fluid Musculoskeletal: Degenerative disc disease changes greatest at L3-L41. No destructive bone lesions. IMPRESSION: Mass at head and uncinate process of pancreas 3.1 x 3.3 x 3.1 cm highly suspicious for a primary pancreatic neoplasm with associated marked intrahepatic and extrahepatic biliary dilatation. Suspected RIGHT lower lobe and hepatic metastases as above. Aortic atherosclerosis and coronary arterial atherosclerotic cyst. Findings discussed with Dr. RGala Romneyprior to dictation of this report on 11/20/2015. Electronically Signed   By: MLavonia DanaM.D.   On: 11/20/2015 13:21   Ct Cervical Spine Wo Contrast  11/20/2015  CLINICAL DATA:  Patient found on floor. Concern for head or cervical spine  injury. Initial encounter. EXAM: CT HEAD WITHOUT CONTRAST CT CERVICAL SPINE WITHOUT CONTRAST TECHNIQUE: Multidetector CT imaging of the head and cervical spine was performed following the standard protocol without intravenous contrast. Multiplanar CT image reconstructions of the cervical spine were also generated. COMPARISON:  None. FINDINGS: CT HEAD FINDINGS There is no evidence of acute infarction, mass lesion, or intra- or extra-axial hemorrhage on CT. Prominence of the ventricles and sulci reflects mild cortical volume loss. Mild cerebellar atrophy is noted. Scattered  periventricular and subcortical matter change likely reflects small vessel ischemic microangiopathy. The brainstem and fourth ventricle are within normal limits. The basal ganglia are unremarkable in appearance. The cerebral hemispheres demonstrate grossly normal gray-white differentiation. No mass effect or midline shift is seen. There is no evidence of fracture; visualized osseous structures are unremarkable in appearance. The orbits are within normal limits. The paranasal sinuses and mastoid air cells are well-aerated. No significant soft tissue abnormalities are seen. CT CERVICAL SPINE FINDINGS There is no evidence of acute fracture or subluxation. Vertebral bodies demonstrate normal height. Mild grade 1 anterolisthesis is noted of C3 on C4 and of C4 on C5. Multilevel disc space narrowing is noted along the cervical spine, with scattered anterior and posterior disc osteophyte complexes. Prevertebral soft tissues are within normal limits. Facet disease is noted along the cervical spine. The thyroid gland is unremarkable in appearance. The visualized lung apices are clear. Scattered calcification is noted at the carotid bifurcations bilaterally. IMPRESSION: 1. No evidence of traumatic intracranial injury or fracture. 2. No evidence of acute fracture or subluxation along the cervical spine. 3. Mild cortical volume loss and scattered small vessel ischemic microangiopathy. 4. Mild degenerative change along the cervical spine. 5. Scattered calcification at the carotid bifurcations bilaterally. Carotid ultrasound would be helpful for further evaluation, when and as deemed clinically appropriate. Electronically Signed   By: Garald Balding M.D.   On: 11/20/2015 02:54   Ct Abdomen Pelvis W Contrast  11/20/2015  CLINICAL DATA:  Elevated LFTs, transaminitis, jaundice, total bilirubin =24.2, history hypertension EXAM: CT CHEST, ABDOMEN, AND PELVIS WITH CONTRAST TECHNIQUE: Multidetector CT imaging of the chest, abdomen and  pelvis was performed following the standard protocol during bolus administration of intravenous contrast. Sagittal and coronal MPR images reconstructed from axial data set. CONTRAST:  164m ISOVUE-300 IOPAMIDOL (ISOVUE-300) INJECTION 61% IV. Dilute oral contrast. COMPARISON:  None FINDINGS: CT CHEST FINDINGS Cardiovascular: Significant atherosclerotic calcification aorta, coronary arteries and great vessels. Large dense calcified plaque at innominate artery. No aortic aneurysm or dissection. Pulmonary arteries grossly patent on nondedicated exam. Mediastinum/Lymph Nodes: Tracheobronchial cartilage calcification extending into hila. No thoracic adenopathy. Esophagus unremarkable. Lungs/Pleura: Lobulated mass with slightly spiculated margins in RIGHT lower lobe, partially chronic center, overall 2.2 x 2.2 x 2.4 cm compatible with tumor, question primary versus metastatic. Additional nodule with slightly ill-defined margins 7 mm diameter anterior RIGHT lower lobe image 54. Questionable subpleural nodule lingula 4 mm diameter image 57. Minimal bibasilar atelectasis. No infiltrate, pleural effusion, or pneumothorax. Musculoskeletal: Calcified subcoracoid loose body RIGHT shoulder with RIGHT glenohumeral degenerative changes. Advanced glenohumeral degenerative changes with spurring and calcific debris at LEFT glenohumeral joint. Diffuse osseous demineralization. No destructive bone lesions. CT ABDOMEN PELVIS FINDINGS Hepatobiliary: Marked intrahepatic and extrahepatic biliary dilatation. CBD 3.0 cm transverse. Poorly defined low-attenuation lesion at medial RIGHT lobe liver 14 x 12 mm question metastasis. Pancreas: Poorly defined low-attenuation mass at head and uncinate process of pancreas, 3.1 x 3.3 x 3.1 cm highly suspicious for a primary pancreatic neoplasm. Marked  atrophy of the pancreatic body and tail with significant pancreatic ductal dilatation up to 11 mm diameter. Mass abuts the SMA and SMV as well as anterior  margin of the aorta and third portion of duodenum. Spleen: Normal appearance Adrenals/Urinary Tract: Exophytic cyst lateral RIGHT kidney 2.0 x 1.4 cm image 71. Kidneys and adrenal glands otherwise normal appearance. No hydronephrosis or hydroureter. No urinary tract calcification. Bladder well distended and unremarkable. Stomach/Bowel: Descending duodenum displaced laterally by dilated CBD. Stomach and bowel loops otherwise grossly normal appearance Vascular/Lymphatic: Extensive atherosclerotic calcifications aorta and abdominal branch vessels. Reproductive: Uterus surgically absent with nonvisualization of ovaries Other: No free air or free fluid Musculoskeletal: Degenerative disc disease changes greatest at L3-L41. No destructive bone lesions. IMPRESSION: Mass at head and uncinate process of pancreas 3.1 x 3.3 x 3.1 cm highly suspicious for a primary pancreatic neoplasm with associated marked intrahepatic and extrahepatic biliary dilatation. Suspected RIGHT lower lobe and hepatic metastases as above. Aortic atherosclerosis and coronary arterial atherosclerotic cyst. Findings discussed with Dr. Gala Romney prior to dictation of this report on 11/20/2015. Electronically Signed   By: Lavonia Dana M.D.   On: 11/20/2015 13:21   Dg Ercp Biliary & Pancreatic Ducts  11/21/2015  CLINICAL DATA:  ERCP with sphincterotomy and stent placement for pancreatic cancer EXAM: ERCP TECHNIQUE: Multiple spot images obtained with the fluoroscopic device and submitted for interpretation post-procedure. FLUOROSCOPY TIME:  Radiation Exposure Index (as provided by the fluoroscopic device): 131 mGy COMPARISON:  11/20/2015 FINDINGS: Sequential images obtained during ERCP show catheterization of the common bile duct. Contrast injection demonstrates a markedly dilated common bile duct with abrupt cut off at the expected level of the pancreatic head. On the final images metallic stent placement can be seen. IMPRESSION: 1. ERCP demonstrates common  bile duct obstruction secondary to pancreatic head mass. A metallic stent is placed across the level of obstruction. These images were submitted for radiologic interpretation only. Please see the procedural report for the amount of contrast and the fluoroscopy time utilized. Electronically Signed   By: Kerby Moors M.D.   On: 11/21/2015 16:29   Labs: Basic Metabolic Panel:  Recent Labs Lab 11/21/15 0537 11/22/15 0523 11/23/15 0510 11/24/15 0547 11/25/15 0637  NA 125* 125* 126* 127* 130*  K 3.3* 3.9 4.1 4.0 3.9  CL 90* 91* 94* 92* 93*  CO2 26 26 24 28 28   GLUCOSE 94 99 118* 115* 104*  BUN 11 11 10 10 13   CREATININE <0.30* <0.30* 0.32* 0.56 0.68  CALCIUM 7.4* 7.1* 6.9* 7.1* 7.2*   Liver Function Tests:  Recent Labs Lab 11/21/15 0537 11/22/15 0523 11/23/15 0510 11/24/15 0547 11/25/15 0637  AST 212* 223* 148* 129* 121*  ALT 147* 156* 134* 122* 112*  ALKPHOS 401* 387* 347* 340* 309*  BILITOT 23.7* 20.9* 14.0* 12.2* 9.7*  PROT 6.1* 5.9* 5.7* 6.2* 6.0*  ALBUMIN 2.2* 2.1* 2.0* 2.3* 2.2*    Recent Labs Lab 11/20/15 0110  LIPASE 30    Recent Labs Lab 11/20/15 0200  AMMONIA 33   CBC:  Recent Labs Lab 11/20/15 0110 11/20/15 0548 11/21/15 0537 11/22/15 0523  WBC 10.2 9.2 9.0 10.4  NEUTROABS 8.5*  --   --   --   HGB 11.1* 10.1* 9.7* 9.5*  HCT 30.9* 28.3* 27.4* 27.2*  MCV 80.3 80.2 80.1 80.2  PLT 447* 399 426* 427*   Cardiac Enzymes:  Recent Labs Lab 11/20/15 0110 11/20/15 0548 11/20/15 1311 11/20/15 1857  CKTOTAL 222  --   --   --  TROPONINI 0.04* 0.05* 0.05* 0.06*    Signed:  Reiana Poteet MD. Rosalita Chessman.  [facility] 11/25/2015, 1:01 PM

## 2015-11-25 NOTE — Progress Notes (Signed)
CM spoke with family and patient regarding d/c planning.  Patient has made a decision to d/c to SNF and wants to go to [facility] and second choice [facility].  CM contacted Education officer, museum and left message.

## 2015-11-26 NOTE — Clinical Social Work Note (Addendum)
Patient accepted at [facility]. D/C summary updated and orders placed by MD. Patient and family agreeable to d/c for today. Dtr advised to arrive at SNF to complete admissions paperwork before patient arrives. RN and RNCM aware.   Clinical Social Worker facilitated patient discharge including contacting patient family and facility to confirm patient discharge plans.  Clinical information faxed to facility and family agreeable with plan.  CSW arranged ambulance transport via [facility] to [facility].  RN to call report prior to discharge [phone]. Med Necessity form completed for [facility]. RN to print.   Clinical Social Worker will sign off for now as social work intervention is no longer needed. Please consult [place] again if new need arises.  Glendon Axe, MSW, LCSWA [phone] 11/26/2015 2:22 PM

## 2015-11-26 NOTE — Clinical Social Work Note (Signed)
Clinical Social Worker notified by [facility] that patient has changed her mind about d/c'ing to SNF. Per RNCM, patient's first preference is [facility] and then [facility]. CSW has contacted both SNF's and left messages for returned phone call.   Patient's dtr agreeable to patient going to alternative SNF in the [place] area. CSW has reached out to several SNF's in the [place] area however have not received a call back from facility representatives. Patient and pt's dtr not fully agreeable to SNF in [place] aware of above.   PT recommendation remains as Home Health.   Case discussed with CSW AD and RNCM, Kim.   UPDATE: CSW received call from [facility] at [facility]. Facility rep planning to review referral and verify benefits once arriving to facility around [facility]. Patient and family notified. CSW to notify RNCM.   Glendon Axe, MSW, LCSWA [phone] 11/26/2015 1:21 PM

## 2015-11-26 NOTE — Progress Notes (Signed)
Pt's IV catheter removed and intact. Pt's IV site clean dry and intact. Report called and given to [employer] ([facility], Rafael Gonzalez).All questions were answered and no further questions at this time. Pt in stable condition and in no acute distress at time of discharge. Pt will be transported via [facility].

## 2015-11-26 NOTE — Discharge Summary (Signed)
ADDENDUM TO DISCHARGE SUMMARY 11/26/15  Patient now would like to go to SNF.  LCSW arranged and bed is ready.  I spoke with patient and confirmed her wishes.  She is also expressed that she would like a FULL CODE.  No changes medically, and she is stable for discharge.  Please follow up electrolytes next week. Fluid restrict to 1200cc per day recommended.    Orvan Falconer, MD FACP.  Hospital  Medicine.

## 2015-11-26 NOTE — Progress Notes (Signed)
On 11/25/2015 patient's daughter stated that she is unable to fully care for her mother. Pt's daughter stated that she spoke with her mother and they mutually agreed to SNF [facility]. Case Manager Ms. Briant Sites was then contacted and informed of the patient and family request to go to SNF. Informed by case manager that social worker at [employer] was working on [employer] and placement.

## 2015-11-26 NOTE — Clinical Social Work Placement (Signed)
   CLINICAL SOCIAL WORK PLACEMENT  NOTE  Date:  11/26/2015  Patient Details  Name: Sophia Ramirez MRN: [id number] Date of Birth: April 28, 1926  Clinical Social Work is seeking post-discharge placement for this patient at the [place] level of care (*CSW will initial, date and re-position this form in  chart as items are completed):  Yes   Patient/family provided with [facility] Work Department's list of facilities offering this level of care within the geographic area requested by the patient (or if unable, by the patient's family).  Yes   Patient/family informed of their freedom to choose among providers that offer the needed level of care, that participate in Medicare, Medicaid or managed care program needed by the patient, have an available bed and are willing to accept the patient.  Yes   Patient/family informed of [facility]'s ownership interest in [facility] and [facility], as well as of the fact that they are under no obligation to receive care at these facilities.  PASRR submitted to EDS on 11/24/15     PASRR number received on 11/24/15     Existing PASRR number confirmed on       FL2 transmitted to all facilities in geographic area requested by pt/family on 11/26/15     FL2 transmitted to all facilities within larger geographic area on       Patient informed that his/her managed care company has contracts with or will negotiate with certain facilities, including the following:        Yes   Patient/family informed of bed offers received.  Patient chooses bed at  ([facility])     Physician recommends and patient chooses bed at      Patient to be transferred to  ([facility]) on 11/26/15.  Patient to be transferred to facility by  [facility])     Patient family notified on 11/26/15 of transfer.  Name of family member notified:   (Pt's dtr, Ms. Sophia Ramirez)     PHYSICIAN Please prepare priority discharge summary,  including medications, Please sign FL2     Additional Comment:    _______________________________________________ Rozell Searing, LCSW 11/26/2015, 2:25 PM

## 2015-11-26 NOTE — NC FL2 (Signed)
MEDICAID FL2 LEVEL OF CARE SCREENING TOOL     IDENTIFICATION  Patient Name: Sophia Ramirez Birthdate: 27-Dec-1925 Sex: female Admission Date (Current Location): 11/20/2015  [facility] and [place] Number:  [employer] and Address:  [facility], [place] [address], [place],  [address]      Provider Number: [id number]  Attending Physician Name and Address:  Orvan Falconer, MD  Relative Name and Phone Number:       Current Level of Care: Hospital Recommended Level of Care: [place] Prior Approval Number:    Date Approved/Denied:   PASRR Number: [id number]   Discharge Plan: SNF    Current Diagnoses: Patient Active Problem List   Diagnosis Date Noted  . Chest pain   . Transaminitis   . Fall   . Hyperbilirubinemia   . Pancreatic cancer ([place])   . Palliative care encounter   . DNR (do not resuscitate) discussion   . Goals of care, counseling/discussion   . Hyponatremia 11/20/2015  . Elevated LFTs   . GERD 01/13/2008  . RENAL CYST 01/13/2008  . NAUSEA WITH VOMITING 01/13/2008  . ABDOMINAL PAIN, UNSPECIFIED SITE 01/13/2008  . CONSTIPATION, HX OF 01/13/2008  . HYPERTENSION 12/23/2007  . 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 WEIGHT LOSS 12/23/2007  . DYSPHAGIA UNSPECIFIED 12/23/2007    Orientation RESPIRATION BLADDER Height & Weight     Self, Time, Situation, Place  Normal Incontinent Weight: 138 lb (62.596 kg) Height:  5\' 5"  (165.1 cm)  BEHAVIORAL SYMPTOMS/MOOD NEUROLOGICAL BOWEL NUTRITION STATUS   (NONE )  (NONE ) Continent Diet (HEART HEALTHY )  AMBULATORY STATUS COMMUNICATION OF NEEDS Skin   Extensive Assist Verbally Normal                       Personal Care Assistance Level of Assistance  Bathing, Feeding, Dressing Bathing Assistance: Limited assistance Feeding assistance: Independent Dressing Assistance: Limited assistance     Functional Limitations Info  Sight, Hearing, Speech Sight Info:  Adequate Hearing Info: Adequate Speech Info: Adequate    SPECIAL CARE FACTORS FREQUENCY  PT (By licensed PT)     PT Frequency: 4              Contractures      Additional Factors Info  Code Status, Allergies Code Status Info: FULL CODE  Allergies Info: N/A  Psychotropic Info: Xanax         Current Medications (11/26/2015):  This is the current hospital active medication list Current Facility-Administered Medications  Medication Dose Route Frequency Provider Last Rate Last Dose  . 0.9 % NaCl with KCl 40 mEq / L  infusion   Intravenous Continuous Reyne Dumas, MD 100 mL/hr at 11/26/15 0406 100 mL/hr at 11/26/15 0406  . acetaminophen (TYLENOL) tablet 325 mg  325 mg Oral Q6H PRN Rogene Houston, MD      . ALPRAZolam Duanne Moron) tablet 0.25 mg  0.25 mg Oral TID Oswald Hillock, MD   0.25 mg at 11/26/15 0911  . feeding supplement (ENSURE ENLIVE) (ENSURE ENLIVE) liquid 237 mL  237 mL Oral BID BM Orvan Falconer, MD   237 mL at 11/26/15 0912  . furosemide (LASIX) injection 20 mg  20 mg Intravenous BID Orvan Falconer, MD   20 mg at 11/26/15 0734  . pantoprazole (PROTONIX) EC tablet 40 mg  40 mg Oral Daily Rogene Houston, MD   40 mg at 11/26/15 0911  . ramipril (ALTACE) capsule 2.5  mg  2.5 mg Oral Daily Oswald Hillock, MD   2.5 mg at 11/26/15 0910  . sodium chloride flush (NS) 0.9 % injection 3 mL  3 mL Intravenous Q12H Oswald Hillock, MD   3 mL at 11/26/15 0916  . verapamil (CALAN-SR) CR tablet 240 mg  240 mg Oral Daily Oswald Hillock, MD   240 mg at 11/26/15 0911     Discharge Medications: Please see discharge summary for a list of discharge medications.  Relevant Imaging Results:  Relevant Lab Results:   Additional Information SSN [id number]  Rozell Searing, LCSW

## 2015-11-30 ENCOUNTER — Encounter (HOSPITAL_COMMUNITY): Payer: Self-pay | Admitting: Internal Medicine

## 2015-12-02 ENCOUNTER — Encounter (HOSPITAL_COMMUNITY): Payer: Self-pay | Admitting: Emergency Medicine

## 2015-12-02 ENCOUNTER — Emergency Department (HOSPITAL_COMMUNITY): Payer: Medicare Other

## 2015-12-02 ENCOUNTER — Inpatient Hospital Stay (HOSPITAL_COMMUNITY)
Admission: EM | Admit: 2015-12-02 | Discharge: 2015-12-07 | DRG: 377 | Disposition: A | Discharge status: Hospice | Payer: Medicare Other | Attending: Internal Medicine | Admitting: Internal Medicine

## 2015-12-02 DIAGNOSIS — K210 Gastro-esophageal reflux disease with esophagitis: Secondary | ICD-10-CM | POA: Diagnosis present

## 2015-12-02 DIAGNOSIS — Z66 Do not resuscitate: Secondary | ICD-10-CM | POA: Diagnosis present

## 2015-12-02 DIAGNOSIS — I10 Essential (primary) hypertension: Secondary | ICD-10-CM | POA: Diagnosis present

## 2015-12-02 DIAGNOSIS — Z515 Encounter for palliative care: Secondary | ICD-10-CM | POA: Diagnosis present

## 2015-12-02 DIAGNOSIS — C250 Malignant neoplasm of head of pancreas: Secondary | ICD-10-CM | POA: Diagnosis present

## 2015-12-02 DIAGNOSIS — C259 Malignant neoplasm of pancreas, unspecified: Secondary | ICD-10-CM | POA: Diagnosis present

## 2015-12-02 DIAGNOSIS — M1990 Unspecified osteoarthritis, unspecified site: Secondary | ICD-10-CM | POA: Diagnosis present

## 2015-12-02 DIAGNOSIS — C787 Secondary malignant neoplasm of liver and intrahepatic bile duct: Secondary | ICD-10-CM | POA: Diagnosis present

## 2015-12-02 DIAGNOSIS — D638 Anemia in other chronic diseases classified elsewhere: Secondary | ICD-10-CM | POA: Diagnosis present

## 2015-12-02 DIAGNOSIS — E43 Unspecified severe protein-calorie malnutrition: Secondary | ICD-10-CM | POA: Diagnosis present

## 2015-12-02 DIAGNOSIS — K922 Gastrointestinal hemorrhage, unspecified: Secondary | ICD-10-CM

## 2015-12-02 DIAGNOSIS — Z79899 Other long term (current) drug therapy: Secondary | ICD-10-CM

## 2015-12-02 DIAGNOSIS — F419 Anxiety disorder, unspecified: Secondary | ICD-10-CM | POA: Diagnosis present

## 2015-12-02 DIAGNOSIS — E871 Hypo-osmolality and hyponatremia: Secondary | ICD-10-CM | POA: Diagnosis present

## 2015-12-02 DIAGNOSIS — K831 Obstruction of bile duct: Secondary | ICD-10-CM | POA: Diagnosis present

## 2015-12-02 DIAGNOSIS — D62 Acute posthemorrhagic anemia: Secondary | ICD-10-CM

## 2015-12-02 DIAGNOSIS — R7989 Other specified abnormal findings of blood chemistry: Secondary | ICD-10-CM | POA: Diagnosis not present

## 2015-12-02 DIAGNOSIS — K920 Hematemesis: Secondary | ICD-10-CM | POA: Diagnosis present

## 2015-12-02 DIAGNOSIS — K219 Gastro-esophageal reflux disease without esophagitis: Secondary | ICD-10-CM | POA: Diagnosis present

## 2015-12-02 DIAGNOSIS — F0390 Unspecified dementia without behavioral disturbance: Secondary | ICD-10-CM | POA: Diagnosis present

## 2015-12-02 DIAGNOSIS — Z6822 Body mass index (BMI) 22.0-22.9, adult: Secondary | ICD-10-CM | POA: Diagnosis not present

## 2015-12-02 DIAGNOSIS — E876 Hypokalemia: Secondary | ICD-10-CM | POA: Diagnosis present

## 2015-12-02 DIAGNOSIS — R778 Other specified abnormalities of plasma proteins: Secondary | ICD-10-CM | POA: Diagnosis present

## 2015-12-02 DIAGNOSIS — D649 Anemia, unspecified: Secondary | ICD-10-CM | POA: Diagnosis present

## 2015-12-02 DIAGNOSIS — R4182 Altered mental status, unspecified: Secondary | ICD-10-CM | POA: Diagnosis not present

## 2015-12-02 DIAGNOSIS — R945 Abnormal results of liver function studies: Secondary | ICD-10-CM

## 2015-12-02 DIAGNOSIS — Z7189 Other specified counseling: Secondary | ICD-10-CM | POA: Diagnosis not present

## 2015-12-02 DIAGNOSIS — G9340 Encephalopathy, unspecified: Secondary | ICD-10-CM | POA: Diagnosis present

## 2015-12-02 DIAGNOSIS — C801 Malignant (primary) neoplasm, unspecified: Secondary | ICD-10-CM | POA: Diagnosis present

## 2015-12-02 HISTORY — DX: Unspecified dementia, unspecified severity, without behavioral disturbance, psychotic disturbance, mood disturbance, and anxiety: F03.90

## 2015-12-02 HISTORY — DX: Malignant neoplasm of pancreas, unspecified: C25.9

## 2015-12-02 LAB — COMPREHENSIVE METABOLIC PANEL
ALK PHOS: 294 U/L — AB (ref 38–126)
ALT: 109 U/L — AB (ref 14–54)
ALT: 105 U/L — AB (ref 14–54)
AST: 102 U/L — ABNORMAL HIGH (ref 15–41)
AST: 102 U/L — ABNORMAL HIGH (ref 15–41)
Albumin: 2.7 g/dL — ABNORMAL LOW (ref 3.5–5.0)
Albumin: 2.9 g/dL — ABNORMAL LOW (ref 3.5–5.0)
Alkaline Phosphatase: 286 U/L — ABNORMAL HIGH (ref 38–126)
Anion gap: 10 (ref 5–15)
Anion gap: 10 (ref 5–15)
BILIRUBIN TOTAL: 6 mg/dL — AB (ref 0.3–1.2)
BUN: 15 mg/dL (ref 6–20)
BUN: 14 mg/dL (ref 6–20)
CALCIUM: 8.8 mg/dL — AB (ref 8.9–10.3)
CHLORIDE: 86 mmol/L — AB (ref 101–111)
CHLORIDE: 88 mmol/L — AB (ref 101–111)
CO2: 29 mmol/L (ref 22–32)
CO2: 28 mmol/L (ref 22–32)
CREATININE: 0.54 mg/dL (ref 0.44–1.00)
Calcium: 8.7 mg/dL — ABNORMAL LOW (ref 8.9–10.3)
Creatinine, Ser: 0.52 mg/dL (ref 0.44–1.00)
GFR calc Af Amer: >60 mL/min (ref >60)
GFR calc non Af Amer: >60 mL/min (ref >60)
GLUCOSE: 144 mg/dL — AB (ref 65–99)
Glucose, Bld: 148 mg/dL — ABNORMAL HIGH (ref 65–99)
POTASSIUM: 2.5 mmol/L — AB (ref 3.5–5.1)
Potassium: 3.3 mmol/L — ABNORMAL LOW (ref 3.5–5.1)
Sodium: 125 mmol/L — ABNORMAL LOW (ref 135–145)
Sodium: 126 mmol/L — ABNORMAL LOW (ref 135–145)
TOTAL PROTEIN: 6.9 g/dL (ref 6.5–8.1)
Total Bilirubin: 5.8 mg/dL — ABNORMAL HIGH (ref 0.3–1.2)
Total Protein: 7 g/dL (ref 6.5–8.1)

## 2015-12-02 LAB — URINALYSIS, ROUTINE W REFLEX MICROSCOPIC
GLUCOSE, UA: 100 mg/dL — AB
HGB URINE DIPSTICK: NEGATIVE
Ketones, ur: NEGATIVE mg/dL
Leukocytes, UA: NEGATIVE
Nitrite: NEGATIVE
PH: 7 (ref 5.0–8.0)
PROTEIN: NEGATIVE mg/dL
Specific Gravity, Urine: 1.01 (ref 1.005–1.030)

## 2015-12-02 LAB — TYPE AND SCREEN
ABO/RH(D): O NEG
ANTIBODY SCREEN: NEGATIVE

## 2015-12-02 LAB — POC OCCULT BLOOD, ED: FECAL OCCULT BLD: NEGATIVE

## 2015-12-02 LAB — CBC WITH DIFFERENTIAL/PLATELET
Basophils Absolute: 0 10*3/uL (ref 0.0–0.1)
Basophils Relative: 0 %
EOS PCT: 0 %
Eosinophils Absolute: 0 10*3/uL (ref 0.0–0.7)
HEMATOCRIT: 25.2 % — AB (ref 36.0–46.0)
Hemoglobin: 8.7 g/dL — ABNORMAL LOW (ref 12.0–15.0)
LYMPHS ABS: 0.8 10*3/uL (ref 0.7–4.0)
LYMPHS PCT: 11 %
MCH: 29.8 pg (ref 26.0–34.0)
MCHC: 34.5 g/dL (ref 30.0–36.0)
MCV: 86.3 fL (ref 78.0–100.0)
MONOS PCT: 10 %
Monocytes Absolute: 0.8 10*3/uL (ref 0.1–1.0)
NEUTROS ABS: 6 10*3/uL (ref 1.7–7.7)
Neutrophils Relative %: 79 %
PLATELETS: 573 10*3/uL — AB (ref 150–400)
RBC: 2.92 MIL/uL — AB (ref 3.87–5.11)
RDW: 16.4 % — ABNORMAL HIGH (ref 11.5–15.5)
WBC: 7.6 10*3/uL (ref 4.0–10.5)

## 2015-12-02 LAB — AMMONIA: AMMONIA: 30 umol/L (ref 9–35)

## 2015-12-02 LAB — TROPONIN I
TROPONIN I: 0.04 ng/mL — AB (ref <0.03)
Troponin I: 0.05 ng/mL (ref <0.03)

## 2015-12-02 LAB — MAGNESIUM: MAGNESIUM: 1.4 mg/dL — AB (ref 1.7–2.4)

## 2015-12-02 LAB — LIPASE, BLOOD: Lipase: 20 U/L (ref 11–51)

## 2015-12-02 LAB — MRSA PCR SCREENING: MRSA by PCR: NEGATIVE

## 2015-12-02 MED ORDER — POTASSIUM CHLORIDE 10 MEQ/100ML IV SOLN
10.0000 meq | Freq: Once | INTRAVENOUS | Status: AC
  Administered 2015-12-02: 10 meq via INTRAVENOUS
  Filled 2015-12-02: qty 100

## 2015-12-02 MED ORDER — RAMIPRIL 1.25 MG PO CAPS
2.5000 mg | ORAL_CAPSULE | Freq: Every day | ORAL | Status: DC
  Administered 2015-12-04 – 2015-12-06 (×3): 2.5 mg via ORAL
  Filled 2015-12-02: qty 1
  Filled 2015-12-02 (×5): qty 2
  Filled 2015-12-02: qty 1

## 2015-12-02 MED ORDER — ONDANSETRON HCL 4 MG PO TABS: 4.0000 mg | ORAL_TABLET | Freq: Four times a day (QID) | ORAL | Status: DC | PRN

## 2015-12-02 MED ORDER — ACETAMINOPHEN 325 MG PO TABS: 650.0000 mg | ORAL_TABLET | Freq: Four times a day (QID) | ORAL | Status: DC | PRN

## 2015-12-02 MED ORDER — SODIUM CHLORIDE 0.9 % IV SOLN
INTRAVENOUS | Status: DC
  Administered 2015-12-02 – 2015-12-04 (×4): via INTRAVENOUS

## 2015-12-02 MED ORDER — POTASSIUM CHLORIDE 10 MEQ/100ML IV SOLN
10.0000 meq | INTRAVENOUS | Status: AC
  Administered 2015-12-02 – 2015-12-03 (×6): 10 meq via INTRAVENOUS
  Filled 2015-12-02 (×4): qty 100

## 2015-12-02 MED ORDER — PANTOPRAZOLE SODIUM 40 MG IV SOLR
40.0000 mg | Freq: Once | INTRAVENOUS | Status: AC
  Administered 2015-12-02: 40 mg via INTRAVENOUS
  Filled 2015-12-02: qty 40

## 2015-12-02 MED ORDER — HEPARIN SODIUM (PORCINE) 5000 UNIT/ML IJ SOLN
5000.0000 [IU] | Freq: Three times a day (TID) | INTRAMUSCULAR | Status: DC
  Administered 2015-12-02 – 2015-12-06 (×12): 5000 [IU] via SUBCUTANEOUS
  Filled 2015-12-02 (×12): qty 1

## 2015-12-02 MED ORDER — HALOPERIDOL LACTATE 5 MG/ML IJ SOLN
2.0000 mg | Freq: Four times a day (QID) | INTRAMUSCULAR | Status: DC | PRN
  Administered 2015-12-03: 2 mg via INTRAMUSCULAR
  Filled 2015-12-02: qty 1

## 2015-12-02 MED ORDER — ALPRAZOLAM 0.5 MG PO TABS
0.2500 mg | ORAL_TABLET | Freq: Three times a day (TID) | ORAL | Status: DC
  Administered 2015-12-02 – 2015-12-07 (×11): 0.25 mg via ORAL
  Filled 2015-12-02 (×13): qty 1

## 2015-12-02 MED ORDER — ACETAMINOPHEN 650 MG RE SUPP: 650.0000 mg | Freq: Four times a day (QID) | RECTAL | Status: DC | PRN

## 2015-12-02 MED ORDER — TRAZODONE HCL 50 MG PO TABS
50.0000 mg | ORAL_TABLET | Freq: Once | ORAL | Status: AC
  Administered 2015-12-02: 50 mg via ORAL
  Filled 2015-12-02: qty 1

## 2015-12-02 MED ORDER — ONDANSETRON HCL 4 MG/2ML IJ SOLN: 4.0000 mg | Freq: Four times a day (QID) | INTRAMUSCULAR | Status: DC | PRN

## 2015-12-02 MED ORDER — VERAPAMIL HCL ER 240 MG PO TBCR
240.0000 mg | EXTENDED_RELEASE_TABLET | Freq: Every day | ORAL | Status: DC
Start: 2015-12-03 — End: 2015-12-07
  Administered 2015-12-04 – 2015-12-07 (×4): 240 mg via ORAL
  Filled 2015-12-02 (×6): qty 1

## 2015-12-02 MED ORDER — MAGNESIUM SULFATE 2 GM/50ML IV SOLN
2.0000 g | Freq: Once | INTRAVENOUS | Status: AC
  Administered 2015-12-02: 2 g via INTRAVENOUS
  Filled 2015-12-02: qty 50

## 2015-12-02 MED ORDER — SODIUM CHLORIDE 0.9% FLUSH
3.0000 mL | Freq: Two times a day (BID) | INTRAVENOUS | Status: DC
  Administered 2015-12-03 – 2015-12-06 (×6): 3 mL via INTRAVENOUS

## 2015-12-02 MED ORDER — ONDANSETRON HCL 4 MG/2ML IJ SOLN
4.0000 mg | Freq: Once | INTRAMUSCULAR | Status: AC
  Administered 2015-12-02: 4 mg via INTRAVENOUS
  Filled 2015-12-02: qty 2

## 2015-12-02 NOTE — ED Notes (Addendum)
Brought in by EMS for GI Bleed.  EMS reports that staff at [facility] says pt was vomiting up blood yesterday.  Denies any pain.  Pt is jaundice.

## 2015-12-02 NOTE — Progress Notes (Signed)
2045 posey apron given to patient to help assist with patient's agitation and fidgeting to help prevent patient from pulling off her telemetry wiring and IV tubing. Family at bedside.

## 2015-12-02 NOTE — ED Notes (Signed)
CRITICAL VALUE ALERT  Critical value received:  K+/2.5 and Troponin/0.05  Date of notification:  12/02/15  Time of notification:  [id number]  Critical value read back:Yes.    Nurse who received alert:  Allegra Lai, RN  MD notified (1st page):  Dr. Gilford Raid  Time of first page:  [phone]

## 2015-12-02 NOTE — ED Notes (Addendum)
Hospitalist at bedside 

## 2015-12-02 NOTE — H&P (Signed)
History and Physical    ANNEISHA MUNZER [id number] DOB: 1925/08/25 DOA: 12/02/2015  PCP: [name], MD Patient coming from: SNF - [facility]  Chief Complaint: Hematemesis  HPI: MURANDA KLAES is a 82 y.o. female with medical history significant of hypertension, arthritis, pancreatic obstruction/likely pancreatic cancer, dementia.   Level V caveat: Patient with severe dementia. History provided by EDP, nursing home report and family. At time of admission patient with daughter, son, and other family members present finding the majority of the history. At this point time patient continues to think that she is is without focal complaint.  Per report patient had done well since being discharged from the hospital on 11/26/2015 until approximately 3 days ago when patient began to become somewhat confused. No focal complaint by patient. Per family she has had very little to no appetite and is drinking poorly just water and tea with a small amount of toast another oral intake. No reported shortness of breath, cough, fever, rash, dysuria, frequency, diarrhea, constipation. Per report today at the nursing home patient had a single bout of dark brown tinged emesis. Small volume. No bright red blood noted.   ED Course: Objective data below. Patient given Protonix, Zofran and started on IV fluid with KCl. Hematemesis negative for blood. FOBT negative.  Review of Systems: As per HPI otherwise 10 point review of systems negative.   Ambulatory Status: ambulatory w/ assistance.    Past Medical History  Diagnosis Date  . Hypertension   . Arthritis   . Pancreatic cancer ([place])   . Dementia     Past Surgical History  Procedure Laterality Date  . Shoulder surgery    . Cholecystectomy    . Abdominal hysterectomy    . Ercp N/A 11/21/2015    Procedure: ENDOSCOPIC RETROGRADE CHOLANGIOPANCREATOGRAPHY (ERCP);  Surgeon: Rogene Houston, MD;  [facility];  Service: Endoscopy;  Laterality:  N/A;  . Sphincterotomy N/A 11/21/2015    Procedure: SPHINCTEROTOMY;  Surgeon: Rogene Houston, MD;  [facility];  Service: Endoscopy;  Laterality: N/A;  . Biliary stent placement N/A 11/21/2015    Procedure: BILIARY STENT PLACEMENT;  Surgeon: Rogene Houston, MD;  [facility];  Service: Endoscopy;  Laterality: N/A;  . Esophagogastroduodenoscopy (egd) with propofol N/A 11/21/2015    Procedure: ESOPHAGOGASTRODUODENOSCOPY (EGD) WITH PROPOFOL;  Surgeon: Rogene Houston, MD;  [facility];  Service: Endoscopy;  Laterality: N/A;    Social History   Social History  . Marital Status: Widowed    Spouse Name: N/A  . Number of Children: N/A  . Years of Education: N/A   Occupational History  . Not on file.   Social History Main Topics  . Smoking status: Never Smoker   . Smokeless tobacco: Not on file  . Alcohol Use: No  . Drug Use: No  . Sexual Activity: Not on file   Other Topics Concern  . Not on file   Social History Narrative    No Known Allergies  Family History  Problem Relation Age of Onset  . Colon cancer      unknown    Prior to Admission medications   Medication Sig Start Date End Date Taking? Authorizing Provider  acetaminophen (TYLENOL) 500 MG tablet Take 500 mg by mouth every 6 (six) hours as needed for pain.   Yes Historical Provider, MD  ALPRAZolam (XANAX) 0.25 MG tablet Take 0.25 mg by mouth 3 (three) times daily.   Yes Historical  Provider, MD  ramipril (ALTACE) 2.5 MG tablet Take 2.5 mg by mouth daily.   Yes Historical Provider, MD  verapamil (CALAN-SR) 240 MG CR tablet Take 1 tablet (240 mg total) by mouth daily. 11/25/15  Yes Orvan Falconer, MD    Physical Exam: Filed Vitals:   12/02/15 1600 12/02/15 1615 12/02/15 1630 12/02/15 1700  BP: 153/76  151/93 126/79  Pulse:   101 86  Temp:      TempSrc:      Resp: 22 21 28 21   Height:      Weight:      SpO2:   96% 97%     General: A sting in bed, Appears calm and comfortable Eyes:  Left eye EOMI, EOMI, normal lids, iris. Right eye somewhat sunken in but with intact movement which is at patient's baseline. ENT:  grossly normal hearing, lips & tongue, mmm Neck:  no LAD, masses or thyromegaly Cardiovascular: Faint heart sounds, 2/6 systolic murmur, RRR, No LE edema.  Respiratory:  CTA bilaterally, no w/r/r. Normal respiratory effort. Abdomen:  soft, ntnd, NABS Skin:  no rash or induration seen on limited exam Musculoskeletal:  grossly normal tone BUE/BLE, good ROM, no bony abnormality Psychiatric:  AOx1, patient thinks that she is at home. Recognizes some of the family members in the room. Has no understanding of her current or chronic medical conditions. Pleasant.  Neurologic:  moves all extremities in coordinated fashion, sensation intact  Labs on Admission: I have personally reviewed following labs and imaging studies  CBC:  Recent Labs Lab 12/02/15 1415  WBC 7.6  NEUTROABS 6.0  HGB 8.7*  HCT 25.2*  MCV 86.3  PLT 99991111*   Basic Metabolic Panel:  Recent Labs Lab 12/02/15 1412 12/02/15 1415  NA  --  125*  K  --  2.5*  CL  --  86*  CO2  --  29  GLUCOSE  --  144*  BUN  --  15  CREATININE  --  0.52  CALCIUM  --  8.7*  MG 1.4*  --    GFR: Estimated Creatinine Clearance: 47.3 mL/min (by C-G formula based on Cr of 0.52). Liver Function Tests:  Recent Labs Lab 12/02/15 1415  AST 102*  ALT 109*  ALKPHOS 294*  BILITOT 6.0*  PROT 7.0  ALBUMIN 2.7*    Recent Labs Lab 12/02/15 1415  LIPASE 20   No results for input(s): AMMONIA in the last 168 hours. Coagulation Profile: No results for input(s): INR, PROTIME in the last 168 hours. Cardiac Enzymes:  Recent Labs Lab 12/02/15 1415  TROPONINI 0.05*   BNP (last 3 results) No results for input(s): PROBNP in the last 8760 hours. HbA1C: No results for input(s): HGBA1C in the last 72 hours. CBG: No results for input(s): GLUCAP in the last 168 hours. Lipid Profile: No results for input(s):  CHOL, HDL, LDLCALC, TRIG, CHOLHDL, LDLDIRECT in the last 72 hours. Thyroid Function Tests: No results for input(s): TSH, T4TOTAL, FREET4, T3FREE, THYROIDAB in the last 72 hours. Anemia Panel: No results for input(s): VITAMINB12, FOLATE, FERRITIN, TIBC, IRON, RETICCTPCT in the last 72 hours. Urine analysis:    Component Value Date/Time   COLORURINE YELLOW 02/13/2007 [place] 02/13/2007 1604   LABSPEC <1.005* 02/13/2007 1604   PHURINE 6.0 02/13/2007 [place] 02/13/2007 1604   HGBUR TRACE* 02/13/2007 [place] 02/13/2007 [place] 02/13/2007 [place] 02/13/2007 1604   UROBILINOGEN 0.2 02/13/2007  [place] 02/13/2007 1604   LEUKOCYTESUR SMALL* 02/13/2007 1604    Creatinine Clearance: Estimated Creatinine Clearance: 47.3 mL/min (by C-G formula based on Cr of 0.52).  Sepsis Labs: @LABRCNTIP (procalcitonin:4,lacticidven:4) )No results found for this or any previous visit (from the past 240 hour(s)).   Radiological Exams on Admission: Dg Chest 2 View  12/02/2015  CLINICAL DATA:  Vomiting. EXAM: CHEST  2 VIEW COMPARISON:  Radiograph of November 20, 2015. FINDINGS: The heart size and mediastinal contours are within normal limits. Both lungs are clear. No pneumothorax or pleural effusion is noted. Small hiatal hernia is noted. Degenerative changes are noted in the lower thoracic spine. Aortic atherosclerosis is noted. IMPRESSION: No active cardiopulmonary disease.  Aortic atherosclerosis. Electronically Signed   By: Marijo Conception, M.D.   On: 12/02/2015 15:50    EKG: Independently reviewed.   Assessment/Plan Active Problems:   GERD   Hyponatremia   Elevated LFTs   Hyperbilirubinemia   Pancreatic cancer (HCC)   Hematemesis   Normocytic anemia   Elevated troponin   Pancreatic Cancer: Found and diagnosed during last admission with CT showing 3 x 3 x 3 cm mass at the head of the pancreas.Marland Kitchen ERCP  with stent placement with improvement in overall metabolic condition. Platelet care discussions had with patient at that time and family with unclear resolution of goals of care. At that time patient's mentation was intact and she was kept as a full code though no aggressive measures were decided upon with regards to workup. At this point in time and discussion with family there is no desire with regards to surgical intervention or desires for tissue biopsy for formal diagnoses. No further workup at this time. Of note patient with multiple metabolic derangements most of which are improving including alkaline phosphatase of 294, T bili 6, AST 102, ALT 109, albumin 2.7 - Palliative care consult to again discuss goals of care and hopefully hospice. - discussed the need for HCPOA if mental status does not improve in another 1-2 days.  - At this time patient remains FULL CODE - per pts desires at time of last discharge when mental status was at baseline and pt had capacity.   Confusion: pt demented at baseline, though per family she is typically aware of suroundings and has a good understanding of what is going on in her life. Current episode started 3 days ago. Suspect related to metabolic derangements and possibly from cancer. UA pending. CXR w/o acute process - f/u UA - [place] - treat metabolic derangements  Hematemesis: questionable report of hematemesis by SNF. Emesis in ED w/o evidence of blood. FOBT negative in ED. Recent ERCP on 7/4 w/o mention of ulcerations - CBC, anemia as below - Protonix, carafate  Anemia: currently 8.7. baseline 11. Slow downward trend since 7/3. GI vs malignancy. Normocytic. RDW elevated and thrombopenia of 573 - Anemia panel - CBC in am - Transfuse if <7.   Hyponatremia: Likely secondary to poor nutritional intake including a diet primarily of toast and tea and water and progressive cancer with hepatic metastasis and possible SIADH. - IVF - BMET Q12 w/ Goal  correction of 4mmol/L per day  HypoK: 2.5. Mag 1.4 - Mag - K replacement - BMETs  Elevated Trop: 0.05 - mild. Likely demand given worsening anemia vs trop leak as has been elevated before - cycle trop - EKG in am - Tele  HTN:  - verapamil, Altace   ANxiety: - continue xanax    DVT  prophylaxis: hep  Code Status: FULL  Family Communication: several family members  Disposition Plan: pending improvement vs moving to hospice care  Consults called: none  Admission status: full 0- inpt   Evamarie Raetz J MD [facility]  If 7PM-7AM, please contact night-coverage [email or web address] Password [facility]  12/02/2015, 6:38 PM

## 2015-12-02 NOTE — ED Provider Notes (Signed)
CSN: [id number]     Arrival date & time 12/02/15  1340 History   First MD Initiated Contact with Patient 12/02/15 1345     Chief Complaint  Patient presents with  . GI Bleeding   Pt is an 82 yo bf with a recent diagnosis of metastatic pancreatic cancer.  The pt was admitted recently and was sent to a SNF.  EMS was called today because of vomiting blood.  EMS told the nurse they did not see evidence of this.  The pt has dementia and is unable to give any hx.  (Consider location/radiation/quality/duration/timing/severity/associated sxs/prior Treatment) The history is provided by the patient.    Past Medical History  Diagnosis Date  . Hypertension   . Arthritis   . Pancreatic cancer ([place])   . Dementia    Past Surgical History  Procedure Laterality Date  . Shoulder surgery    . Cholecystectomy    . Abdominal hysterectomy    . Ercp N/A 11/21/2015    Procedure: ENDOSCOPIC RETROGRADE CHOLANGIOPANCREATOGRAPHY (ERCP);  Surgeon: Rogene Houston, MD;  [facility];  Service: Endoscopy;  Laterality: N/A;  . Sphincterotomy N/A 11/21/2015    Procedure: SPHINCTEROTOMY;  Surgeon: Rogene Houston, MD;  [facility];  Service: Endoscopy;  Laterality: N/A;  . Biliary stent placement N/A 11/21/2015    Procedure: BILIARY STENT PLACEMENT;  Surgeon: Rogene Houston, MD;  [facility];  Service: Endoscopy;  Laterality: N/A;  . Esophagogastroduodenoscopy (egd) with propofol N/A 11/21/2015    Procedure: ESOPHAGOGASTRODUODENOSCOPY (EGD) WITH PROPOFOL;  Surgeon: Rogene Houston, MD;  [facility];  Service: Endoscopy;  Laterality: N/A;   Family History  Problem Relation Age of Onset  . Colon cancer      unknown   Social History  Substance Use Topics  . Smoking status: Never Smoker   . Smokeless tobacco: None  . Alcohol Use: No   OB History    No data available     Review of Systems  Unable to perform ROS: Dementia      Allergies  Review of patient's  allergies indicates no known allergies.  Home Medications   Prior to Admission medications   Medication Sig Start Date End Date Taking? Authorizing Provider  acetaminophen (TYLENOL) 500 MG tablet Take 500 mg by mouth every 6 (six) hours as needed for pain.   Yes Historical Provider, MD  ALPRAZolam (XANAX) 0.25 MG tablet Take 0.25 mg by mouth 3 (three) times daily.   Yes Historical Provider, MD  ramipril (ALTACE) 2.5 MG tablet Take 2.5 mg by mouth daily.   Yes Historical Provider, MD  verapamil (CALAN-SR) 240 MG CR tablet Take 1 tablet (240 mg total) by mouth daily. 11/25/15  Yes Orvan Falconer, MD   BP 126/79 mmHg  Pulse 86  Temp(Src) 97.4 F (36.3 C) (Oral)  Resp 21  Ht 5\' 7"  (1.702 m)  Wt 138 lb (62.596 kg)  BMI 21.61 kg/m2  SpO2 97% Physical Exam  Constitutional: She appears well-developed and well-nourished.  HENT:  Head: Normocephalic and atraumatic.  Right Ear: External ear normal.  Left Ear: External ear normal.  Nose: Nose normal.  Mouth/Throat: Oropharynx is clear and moist.  Eyes: Pupils are equal, round, and reactive to light.  Sclera are jaundiced  Neck: Normal range of motion. Neck supple.  Cardiovascular: Normal rate, regular rhythm, normal heart sounds and intact distal pulses.   Pulmonary/Chest: Effort normal and breath sounds normal.  Abdominal: Soft. Bowel sounds  are normal.  Genitourinary: Rectum normal.  guaiac negative  Musculoskeletal: Normal range of motion.  Neurological: She is alert.  Skin: Skin is warm.  jaundiced  Psychiatric: She is agitated.  Nursing note and vitals reviewed.   ED Course  Procedures (including critical care time) Labs Review Labs Reviewed  COMPREHENSIVE METABOLIC PANEL - Abnormal; Notable for the following:    Sodium 125 (*)    Potassium 2.5 (*)    Chloride 86 (*)    Glucose, Bld 144 (*)    Calcium 8.7 (*)    Albumin 2.7 (*)    AST 102 (*)    ALT 109 (*)    Alkaline Phosphatase 294 (*)    Total Bilirubin 6.0 (*)    All  other components within normal limits  CBC WITH DIFFERENTIAL/PLATELET - Abnormal; Notable for the following:    RBC 2.92 (*)    Hemoglobin 8.7 (*)    HCT 25.2 (*)    RDW 16.4 (*)    Platelets 573 (*)    All other components within normal limits  TROPONIN I - Abnormal; Notable for the following:    Troponin I 0.05 (*)    All other components within normal limits  MAGNESIUM - Abnormal; Notable for the following:    Magnesium 1.4 (*)    All other components within normal limits  LIPASE, BLOOD  URINALYSIS, ROUTINE W REFLEX MICROSCOPIC (NOT AT [facility])  POC OCCULT BLOOD, ED  TYPE AND SCREEN    Imaging Review Dg Chest 2 View  12/02/2015  CLINICAL DATA:  Vomiting. EXAM: CHEST  2 VIEW COMPARISON:  Radiograph of November 20, 2015. FINDINGS: The heart size and mediastinal contours are within normal limits. Both lungs are clear. No pneumothorax or pleural effusion is noted. Small hiatal hernia is noted. Degenerative changes are noted in the lower thoracic spine. Aortic atherosclerosis is noted. IMPRESSION: No active cardiopulmonary disease.  Aortic atherosclerosis. Electronically Signed   By: Marijo Conception, M.D.   On: 12/02/2015 15:50   I have personally reviewed and evaluated these images and lab results as part of my medical decision-making.   EKG Interpretation   Date/Time:  Saturday December 02 2015 13:52:32 EDT Ventricular Rate:  78 PR Interval:    QRS Duration: 84 QT Interval:  395 QTC Calculation: 450 R Axis:   9 Text Interpretation:  Sinus rhythm Short PR interval Low voltage,  precordial leads pt is tremulous Confirmed by Breane Grunwald MD, Zakhari Fogel ([id number])  on 12/02/2015 2:12:19 PM      MDM  Pt had an episode of vomiting while here and it was described as dark brown in color.  Pt was d/w Dr. Marily Memos ([facility]) who will admit pt to the hospital.  Final diagnoses:  Upper GI bleed  Hyponatremia  Hypokalemia  Hyperbilirubinemia  Malignant neoplasm of pancreas, unspecified location of malignancy  ([place])  Acute post-hemorrhagic anemia        Isla Pence, MD 12/02/15 1821

## 2015-12-02 NOTE — Progress Notes (Signed)
2026 Patient's family requesting PRN sleep medication d/t family reporting that the patient hasn't slept for the last 2 days. Mid-level notified.

## 2015-12-03 DIAGNOSIS — C250 Malignant neoplasm of head of pancreas: Secondary | ICD-10-CM

## 2015-12-03 LAB — CBC
HEMATOCRIT: 26.9 % — AB (ref 36.0–46.0)
HEMATOCRIT: 24.7 % — AB (ref 36.0–46.0)
HEMOGLOBIN: 9 g/dL — AB (ref 12.0–15.0)
HEMOGLOBIN: 8.3 g/dL — AB (ref 12.0–15.0)
MCH: 29.1 pg (ref 26.0–34.0)
MCH: 29.3 pg (ref 26.0–34.0)
MCHC: 33.5 g/dL (ref 30.0–36.0)
MCHC: 33.6 g/dL (ref 30.0–36.0)
MCV: 87.1 fL (ref 78.0–100.0)
MCV: 87.3 fL (ref 78.0–100.0)
Platelets: 600 10*3/uL — ABNORMAL HIGH (ref 150–400)
Platelets: 522 10*3/uL — ABNORMAL HIGH (ref 150–400)
RBC: 3.09 MIL/uL — ABNORMAL LOW (ref 3.87–5.11)
RBC: 2.83 MIL/uL — AB (ref 3.87–5.11)
RDW: 16.3 % — AB (ref 11.5–15.5)
RDW: 16.4 % — ABNORMAL HIGH (ref 11.5–15.5)
WBC: 8.7 10*3/uL (ref 4.0–10.5)
WBC: 8.9 10*3/uL (ref 4.0–10.5)

## 2015-12-03 LAB — COMPREHENSIVE METABOLIC PANEL
ALT: 94 U/L — ABNORMAL HIGH (ref 14–54)
ALT: 84 U/L — ABNORMAL HIGH (ref 14–54)
ANION GAP: 6 (ref 5–15)
ANION GAP: 6 (ref 5–15)
AST: 83 U/L — ABNORMAL HIGH (ref 15–41)
AST: 74 U/L — AB (ref 15–41)
Albumin: 2.5 g/dL — ABNORMAL LOW (ref 3.5–5.0)
Albumin: 2.4 g/dL — ABNORMAL LOW (ref 3.5–5.0)
Alkaline Phosphatase: 271 U/L — ABNORMAL HIGH (ref 38–126)
Alkaline Phosphatase: 249 U/L — ABNORMAL HIGH (ref 38–126)
BILIRUBIN TOTAL: 5.2 mg/dL — AB (ref 0.3–1.2)
BILIRUBIN TOTAL: 4.6 mg/dL — AB (ref 0.3–1.2)
BUN: 13 mg/dL (ref 6–20)
BUN: 13 mg/dL (ref 6–20)
CALCIUM: 8.6 mg/dL — AB (ref 8.9–10.3)
CHLORIDE: 94 mmol/L — AB (ref 101–111)
CO2: 30 mmol/L (ref 22–32)
CO2: 30 mmol/L (ref 22–32)
Calcium: 8.5 mg/dL — ABNORMAL LOW (ref 8.9–10.3)
Chloride: 92 mmol/L — ABNORMAL LOW (ref 101–111)
Creatinine, Ser: 0.54 mg/dL (ref 0.44–1.00)
Creatinine, Ser: 0.65 mg/dL (ref 0.44–1.00)
GFR calc Af Amer: >60 mL/min (ref >60)
GFR calc Af Amer: >60 mL/min (ref >60)
GLUCOSE: 113 mg/dL — AB (ref 65–99)
Glucose, Bld: 200 mg/dL — ABNORMAL HIGH (ref 65–99)
POTASSIUM: 3.8 mmol/L (ref 3.5–5.1)
POTASSIUM: 3.9 mmol/L (ref 3.5–5.1)
Sodium: 128 mmol/L — ABNORMAL LOW (ref 135–145)
Sodium: 130 mmol/L — ABNORMAL LOW (ref 135–145)
TOTAL PROTEIN: 6.3 g/dL — AB (ref 6.5–8.1)
TOTAL PROTEIN: 6 g/dL — AB (ref 6.5–8.1)

## 2015-12-03 LAB — RETICULOCYTES
RBC.: 3.09 MIL/uL — AB (ref 3.87–5.11)
RETIC COUNT ABSOLUTE: 126.7 10*3/uL (ref 19.0–186.0)
RETIC CT PCT: 4.1 % — AB (ref 0.4–3.1)

## 2015-12-03 LAB — IRON AND TIBC
IRON: 38 ug/dL (ref 28–170)
SATURATION RATIOS: 15 % (ref 10.4–31.8)
TIBC: 255 ug/dL (ref 250–450)
UIBC: 217 ug/dL

## 2015-12-03 LAB — VITAMIN B12: Vitamin B-12: 1076 pg/mL — ABNORMAL HIGH (ref 180–914)

## 2015-12-03 LAB — FERRITIN: FERRITIN: 1219 ng/mL — AB (ref 11–307)

## 2015-12-03 LAB — TROPONIN I
TROPONIN I: 0.05 ng/mL — AB (ref <0.03)
Troponin I: 0.04 ng/mL (ref <0.03)

## 2015-12-03 LAB — FOLATE: Folate: 9.5 ng/mL (ref >5.9)

## 2015-12-03 MED ORDER — POTASSIUM CHLORIDE 10 MEQ/100ML IV SOLN
INTRAVENOUS | Status: AC
  Filled 2015-12-03: qty 100

## 2015-12-03 NOTE — Progress Notes (Signed)
pt not alert. very drowsy. Unable to arouse patient enough to give medication  Will try to give medication at another time. Oswald Hillock, RN

## 2015-12-03 NOTE — Progress Notes (Signed)
Patient noted confused, combative, agitated and trying to get out of bed w/o assistance. Patient talking to persons who were not present. PRN Haldol given as ordered, effective. Patient resting quietly in bed at this time.

## 2015-12-03 NOTE — Progress Notes (Signed)
Patient noted still awake in bed at this time. 1 time dose of Trazodone 50mg  PO given as ordered @ 2235, not effective.

## 2015-12-03 NOTE — Progress Notes (Signed)
PROGRESS NOTE    Sophia Ramirez  [id number] DOB: 02-02-1926 DOA: 12/02/2015 PCP: [name], MD     Brief Narrative:  82 year old woman admitted on 7/15 from home with complaints of hematemesis. Patient is encephalopathic and unable to provide any history. She also has been recently diagnosed with pancreatic mass, likely tumor.   Assessment & Plan:   Active Problems:   GERD   Hyponatremia   Elevated LFTs   Hyperbilirubinemia   Pancreatic cancer (HCC)   Hematemesis   Normocytic anemia   Elevated troponin   Pancreatic mass, likely malignant -Diagnosed during prior admission with a CT scan showing a mass at the head of the pancreas. -Patient had ERCP with palliative stent placement. -Discussions between palliative care and family members and prior admission were inconclusive as they were still waiting for other family members to weigh in. -As discussed with my partner on admission, family does not want a biopsy for formal tissue diagnosis or treatment for what is presumed to be a malignancy. -We will again request palliative care consult to address goals of care, believe this patient would most be suited to residential hospice given her rapid decline.  Hematemesis -This was by report from SNF, she did have emesis in the ED without evidence for blood, FOBT was negative in ED, ERCP on 7/4 without dimensions for ulcerations. -Continue Protonix, Carafate, see no reason for GI consultation at this time.  Elevated troponin -Flat, no EKG changes, no further cardiac workup  Hypertension -Well controlled, continue current medications  Hypokalemia -Replaced  Hyponatremia -Improved with fluids, suspect related to long-term decreased oral intake due to her malignancy.   DVT prophylaxis: Subcutaneous heparin Code Status: Full code Family Communication: None Disposition Plan: Pending palliative care consultation  Consultants:   Palliative care  Procedures:    None  Antimicrobials:   None    Subjective: Lying in bed, restless, does not respond to questions appropriately  Objective: Filed Vitals:   12/02/15 1630 12/02/15 1700 12/02/15 1900 12/03/15 0625  BP: 151/93 126/79  151/84  Pulse: 101 86 92 57  Temp:    97.8 F (36.6 C)  TempSrc:    Oral  Resp: 28 21  20   Height:   5\' 2"  (1.575 m)   Weight:   55 kg (121 lb 4.1 oz)   SpO2: 96% 97% 90% 98%    Intake/Output Summary (Last 24 hours) at 12/03/15 1423 Last data filed at 12/03/15 0950  Gross per 24 hour  Intake    100 ml  Output    200 ml  Net   -100 ml   Filed Weights   12/02/15 1352 12/02/15 1900  Weight: 62.596 kg (138 lb) 55 kg (121 lb 4.1 oz)    Examination:  General exam: Appears somnolent although fidgety, appears jaundiced Respiratory system: Clear to auscultation. Respiratory effort normal. Cardiovascular system:RRR. No murmurs, rubs, gallops. Gastrointestinal system: Abdomen is distended, soft and nontender. No organomegaly or masses felt. Normal bowel sounds heard. Central nervous system: Unable to assess given current mental state Extremities: No C/C/E, +pedal pulses Skin: No rashes, lesions or ulcers Psychiatry: Unable to assess given current mental state    Data Reviewed: I have personally reviewed following labs and imaging studies  CBC:  Recent Labs Lab 12/02/15 1415 12/03/15 0158 12/03/15 0654  WBC 7.6 8.7 8.9  NEUTROABS 6.0  --   --   HGB 8.7* 9.0* 8.3*  HCT 25.2* 26.9* 24.7*  MCV 86.3 87.1 87.3  PLT 573*  600* [id number]*   Basic Metabolic Panel:  Recent Labs Lab 12/02/15 1412 12/02/15 1415 12/02/15 2045 12/03/15 0654  NA  --  125* 126* 128*  K  --  2.5* 3.3* 3.8  CL  --  86* 88* 92*  CO2  --  29 28 30   GLUCOSE  --  144* 148* 113*  BUN  --  15 14 13   CREATININE  --  0.52 0.54 0.54  CALCIUM  --  8.7* 8.8* 8.6*  MG 1.4*  --   --   --    GFR: Estimated Creatinine Clearance: 38.4 mL/min (by C-G formula based on Cr of 0.54). Liver  Function Tests:  Recent Labs Lab 12/02/15 1415 12/02/15 2045 12/03/15 0654  AST 102* 102* 83*  ALT 109* 105* 94*  ALKPHOS 294* 286* 271*  BILITOT 6.0* 5.8* 5.2*  PROT 7.0 6.9 6.3*  ALBUMIN 2.7* 2.9* 2.5*    Recent Labs Lab 12/02/15 1415  LIPASE 20    Recent Labs Lab 12/02/15 2045  AMMONIA 30   Coagulation Profile: No results for input(s): INR, PROTIME in the last 168 hours. Cardiac Enzymes:  Recent Labs Lab 12/02/15 1415 12/02/15 2045 12/03/15 0158 12/03/15 0654  TROPONINI 0.05* 0.04* 0.05* 0.04*   BNP (last 3 results) No results for input(s): PROBNP in the last 8760 hours. HbA1C: No results for input(s): HGBA1C in the last 72 hours. CBG: No results for input(s): GLUCAP in the last 168 hours. Lipid Profile: No results for input(s): CHOL, HDL, LDLCALC, TRIG, CHOLHDL, LDLDIRECT in the last 72 hours. Thyroid Function Tests: No results for input(s): TSH, T4TOTAL, FREET4, T3FREE, THYROIDAB in the last 72 hours. Anemia Panel:  Recent Labs  12/03/15 0158  RETICCTPCT 4.1*   Urine analysis:    Component Value Date/Time   COLORURINE AMBER* 12/02/2015 1835   APPEARANCEUR CLEAR 12/02/2015 1835   LABSPEC 1.010 12/02/2015 1835   PHURINE 7.0 12/02/2015 1835   GLUCOSEU 100* 12/02/2015 1835   HGBUR NEGATIVE 12/02/2015 1835   BILIRUBINUR SMALL* 12/02/2015 1835   KETONESUR NEGATIVE 12/02/2015 1835   PROTEINUR NEGATIVE 12/02/2015 1835   UROBILINOGEN 0.2 02/13/2007 1604   NITRITE NEGATIVE 12/02/2015 1835   LEUKOCYTESUR NEGATIVE 12/02/2015 1835   Sepsis Labs: @LABRCNTIP (procalcitonin:4,lacticidven:4)  ) Recent Results (from the past 240 hour(s))  MRSA PCR Screening     Status: None   Collection Time: 12/02/15  9:29 PM  Result Value Ref Range Status   MRSA by PCR NEGATIVE NEGATIVE Final    Comment:        The GeneXpert MRSA Assay (FDA approved for NASAL specimens only), is one component of a comprehensive MRSA colonization surveillance program. It is  not intended to diagnose MRSA infection nor to guide or monitor treatment for MRSA infections.          Radiology Studies: Dg Chest 2 View  12/02/2015  CLINICAL DATA:  Vomiting. EXAM: CHEST  2 VIEW COMPARISON:  Radiograph of November 20, 2015. FINDINGS: The heart size and mediastinal contours are within normal limits. Both lungs are clear. No pneumothorax or pleural effusion is noted. Small hiatal hernia is noted. Degenerative changes are noted in the lower thoracic spine. Aortic atherosclerosis is noted. IMPRESSION: No active cardiopulmonary disease.  Aortic atherosclerosis. Electronically Signed   By: Marijo Conception, M.D.   On: 12/02/2015 15:50        Scheduled Meds: . ALPRAZolam  0.25 mg Oral TID  . heparin  5,000 Units Subcutaneous Q8H  . ramipril  2.5 mg Oral  Daily  . sodium chloride flush  3 mL Intravenous Q12H  . verapamil  240 mg Oral Daily   Continuous Infusions: . sodium chloride 100 mL/hr at 12/02/15 1948     LOS: 1 day    Time spent: 25 minutes. Greater than 50% of this time was spent in direct contact with the patient coordinating care.     Lelon Frohlich, MD [facility] Pager [phone]  If 7PM-7AM, please contact night-coverage [email or web address] Password [facility] 12/03/2015, 2:23 PM

## 2015-12-04 ENCOUNTER — Encounter (HOSPITAL_COMMUNITY): Payer: Self-pay | Admitting: Primary Care

## 2015-12-04 DIAGNOSIS — E43 Unspecified severe protein-calorie malnutrition: Secondary | ICD-10-CM | POA: Diagnosis present

## 2015-12-04 LAB — COMPREHENSIVE METABOLIC PANEL
ALBUMIN: 2.2 g/dL — AB (ref 3.5–5.0)
ALT: 77 U/L — ABNORMAL HIGH (ref 14–54)
ANION GAP: 6 (ref 5–15)
AST: 60 U/L — ABNORMAL HIGH (ref 15–41)
Alkaline Phosphatase: 250 U/L — ABNORMAL HIGH (ref 38–126)
BILIRUBIN TOTAL: 3.9 mg/dL — AB (ref 0.3–1.2)
BUN: 15 mg/dL (ref 6–20)
CHLORIDE: 95 mmol/L — AB (ref 101–111)
CO2: 30 mmol/L (ref 22–32)
Calcium: 8.2 mg/dL — ABNORMAL LOW (ref 8.9–10.3)
Creatinine, Ser: 0.56 mg/dL (ref 0.44–1.00)
GFR calc Af Amer: >60 mL/min (ref >60)
GFR calc non Af Amer: >60 mL/min (ref >60)
GLUCOSE: 148 mg/dL — AB (ref 65–99)
POTASSIUM: 3.9 mmol/L (ref 3.5–5.1)
Sodium: 131 mmol/L — ABNORMAL LOW (ref 135–145)
TOTAL PROTEIN: 5.7 g/dL — AB (ref 6.5–8.1)

## 2015-12-04 NOTE — Progress Notes (Signed)
PROGRESS NOTE    LEITH MAXIE  [id number] DOB: Apr 27, 1926 DOA: 12/02/2015 PCP: [name], MD     Brief Narrative:  82 year old woman admitted on 7/15 from home with complaints of hematemesis. Patient is encephalopathic and unable to provide any history. She also has been recently diagnosed with pancreatic mass, likely tumor.   Assessment & Plan:   Active Problems:   GERD   Hyponatremia   Elevated LFTs   Hyperbilirubinemia   Pancreatic cancer (HCC)   Hematemesis   Normocytic anemia   Elevated troponin   Protein-calorie malnutrition, severe   Pancreatic mass, likely malignant -Diagnosed during prior admission with a CT scan showing a mass at the head of the pancreas. -Patient had ERCP with palliative stent placement. -Discussions between palliative care and family members and prior admission were inconclusive as they were still waiting for other family members to weigh in. -As discussed with my partner on admission, family does not want a biopsy for formal tissue diagnosis or treatment for what is presumed to be a malignancy. -We will again request palliative care consult to address goals of care, believe this patient would most be suited to residential hospice given her rapid decline.  Hematemesis -This was by report from SNF, she did have emesis in the ED without evidence for blood, FOBT was negative in ED, ERCP on 7/4 without dimensions for ulcerations. -Continue Protonix, Carafate, see no reason for GI consultation at this time.  Elevated troponin -Flat, no EKG changes, no further cardiac workup  Hypertension -Well controlled, continue current medications  Hypokalemia -Replaced  Hyponatremia -Improved with fluids, suspect related to long-term decreased oral intake due to her malignancy.   DVT prophylaxis: Subcutaneous heparin Code Status: Full code Family Communication: None Disposition Plan: Pending palliative care consultation  Consultants:    Palliative care  Procedures:   None  Antimicrobials:   None    Subjective: More alert, nurse tech currently in room helping her eat.  Objective: Filed Vitals:   12/03/15 1524 12/03/15 1917 12/03/15 2216 12/04/15 0551  BP: 116/45  114/47 140/61  Pulse: 62  84 88  Temp: 97.7 F (36.5 C)  98 F (36.7 C)   TempSrc: Oral  Axillary Oral  Resp: 12  16 20   Height:      Weight:      SpO2: 95% 92% 98% 98%    Intake/Output Summary (Last 24 hours) at 12/04/15 1607 Last data filed at 12/04/15 1330  Gross per 24 hour  Intake    360 ml  Output    150 ml  Net    210 ml   Filed Weights   12/02/15 1352 12/02/15 1900  Weight: 62.596 kg (138 lb) 55 kg (121 lb 4.1 oz)    Examination:  General exam: Appears somnolent although fidgety, appears jaundiced Respiratory system: Clear to auscultation. Respiratory effort normal. Cardiovascular system:RRR. No murmurs, rubs, gallops. Gastrointestinal system: Abdomen is distended, soft and nontender. No organomegaly or masses felt. Normal bowel sounds heard. Central nervous system: Unable to assess given current mental state Extremities: No C/C/E, +pedal pulses Skin: No rashes, lesions or ulcers Psychiatry: Unable to assess given current mental state    Data Reviewed: I have personally reviewed following labs and imaging studies  CBC:  Recent Labs Lab 12/02/15 1415 12/03/15 0158 12/03/15 0654  WBC 7.6 8.7 8.9  NEUTROABS 6.0  --   --   HGB 8.7* 9.0* 8.3*  HCT 25.2* 26.9* 24.7*  MCV 86.3 87.1 87.3  PLT 573* 600* [id number]*   Basic Metabolic Panel:  Recent Labs Lab 12/02/15 1412 12/02/15 1415 12/02/15 2045 12/03/15 0654 12/03/15 1941 12/04/15 0652  NA  --  125* 126* 128* 130* 131*  K  --  2.5* 3.3* 3.8 3.9 3.9  CL  --  86* 88* 92* 94* 95*  CO2  --  29 28 30 30 30   GLUCOSE  --  144* 148* 113* 200* 148*  BUN  --  15 14 13 13 15   CREATININE  --  0.52 0.54 0.54 0.65 0.56  CALCIUM  --  8.7* 8.8* 8.6* 8.5* 8.2*  MG 1.4*   --   --   --   --   --    GFR: Estimated Creatinine Clearance: 38.4 mL/min (by C-G formula based on Cr of 0.56). Liver Function Tests:  Recent Labs Lab 12/02/15 1415 12/02/15 2045 12/03/15 0654 12/03/15 1941 12/04/15 0652  AST 102* 102* 83* 74* 60*  ALT 109* 105* 94* 84* 77*  ALKPHOS 294* 286* 271* 249* 250*  BILITOT 6.0* 5.8* 5.2* 4.6* 3.9*  PROT 7.0 6.9 6.3* 6.0* 5.7*  ALBUMIN 2.7* 2.9* 2.5* 2.4* 2.2*    Recent Labs Lab 12/02/15 1415  LIPASE 20    Recent Labs Lab 12/02/15 2045  AMMONIA 30   Coagulation Profile: No results for input(s): INR, PROTIME in the last 168 hours. Cardiac Enzymes:  Recent Labs Lab 12/02/15 1415 12/02/15 2045 12/03/15 0158 12/03/15 0654  TROPONINI 0.05* 0.04* 0.05* 0.04*   BNP (last 3 results) No results for input(s): PROBNP in the last 8760 hours. HbA1C: No results for input(s): HGBA1C in the last 72 hours. CBG: No results for input(s): GLUCAP in the last 168 hours. Lipid Profile: No results for input(s): CHOL, HDL, LDLCALC, TRIG, CHOLHDL, LDLDIRECT in the last 72 hours. Thyroid Function Tests: No results for input(s): TSH, T4TOTAL, FREET4, T3FREE, THYROIDAB in the last 72 hours. Anemia Panel:  Recent Labs  12/02/15 2045 12/03/15 0158  VITAMINB12 1076*  --   FOLATE 9.5  --   FERRITIN 1219*  --   TIBC 255  --   IRON 38  --   RETICCTPCT  --  4.1*   Urine analysis:    Component Value Date/Time   COLORURINE AMBER* 12/02/2015 1835   APPEARANCEUR CLEAR 12/02/2015 1835   LABSPEC 1.010 12/02/2015 1835   PHURINE 7.0 12/02/2015 1835   GLUCOSEU 100* 12/02/2015 1835   HGBUR NEGATIVE 12/02/2015 1835   BILIRUBINUR SMALL* 12/02/2015 1835   KETONESUR NEGATIVE 12/02/2015 1835   PROTEINUR NEGATIVE 12/02/2015 1835   UROBILINOGEN 0.2 02/13/2007 1604   NITRITE NEGATIVE 12/02/2015 1835   LEUKOCYTESUR NEGATIVE 12/02/2015 1835   Sepsis Labs: @LABRCNTIP (procalcitonin:4,lacticidven:4)  ) Recent Results (from the past 240 hour(s))    MRSA PCR Screening     Status: None   Collection Time: 12/02/15  9:29 PM  Result Value Ref Range Status   MRSA by PCR NEGATIVE NEGATIVE Final    Comment:        The GeneXpert MRSA Assay (FDA approved for NASAL specimens only), is one component of a comprehensive MRSA colonization surveillance program. It is not intended to diagnose MRSA infection nor to guide or monitor treatment for MRSA infections.          Radiology Studies: No results found.      Scheduled Meds: . ALPRAZolam  0.25 mg Oral TID  . heparin  5,000 Units Subcutaneous Q8H  . ramipril  2.5 mg Oral Daily  . sodium chloride  flush  3 mL Intravenous Q12H  . verapamil  240 mg Oral Daily   Continuous Infusions: . sodium chloride 100 mL/hr at 12/04/15 1130     LOS: 2 days    Time spent: 25 minutes. Greater than 50% of this time was spent in direct contact with the patient coordinating care.     Lelon Frohlich, MD [facility] Pager [phone]  If 7PM-7AM, please contact night-coverage [email or web address] Password [email or web address] 12/04/2015, 4:07 PM

## 2015-12-04 NOTE — Progress Notes (Signed)
Initial Nutrition Assessment  DOCUMENTATION CODES:   Severe malnutrition in context of chronic illness  INTERVENTION:  Ensure Enlive po BID, each supplement provides 350 kcal and 20 grams of protein   Regular diet ad lib    NUTRITION DIAGNOSIS:   Malnutrition related to catabolic illness (pancreatic mass) as evidenced by severe depletion of muscle mass, severe depletion of body fat. Wt loss of 12% < 30 days.  GOAL:  Provide foods in amounts and textures that pt prefers as desired    MONITOR:  Follow for healthcare decisions and changes in code status or goals of care    REASON FOR ASSESSMENT:   Malnutrition Screening Tool    ASSESSMENT:  Sophia Ramirez is a 82 y.o. female with medical history significant of hypertension, arthritis, pancreatic obstruction/likely pancreatic cancer, dementia. Pt discharged on 7/9. She has not been eating  and her weigth has decreased 12% since recent admission.  She is s/p ERCP with stent placement. Hyponatremia pt was discharged with 1200 ml fluid restriction. Palliative care was following pt last admission and have been consulted to follow up with family and pt regarding pt goals now.   Recent Labs Lab 12/02/15 1412  12/03/15 0654 12/03/15 1941 12/04/15 0652  NA  --   < > 128* 130* 131*  K  --   < > 3.8 3.9 3.9  CL  --   < > 92* 94* 95*  CO2  --   < > 30 30 30   BUN  --   < > 13 13 15   CREATININE  --   < > 0.54 0.65 0.56  CALCIUM  --   < > 8.6* 8.5* 8.2*  MG 1.4*  --   --   --   --   GLUCOSE  --   < > 113* 200* 148*  < > = values in this interval not displayed.  Labs: hyponatremia, hypomagnesemia, hyperglycemia  Nutrition focused exam findings: severe muscle and fat mass depletions. No edema.   Diet Order:  Diet regular Room service appropriate?: Yes; Fluid consistency:: Thin  Skin:   dry and flaky  Last BM:   7/17   Height:   Ht Readings from Last 1 Encounters:  12/02/15 5\' 2"  (1.575 m)    Weight:   Wt Readings  from Last 1 Encounters:  12/02/15 121 lb 4.1 oz (55 kg)    Ideal Body Weight:  50 kg  BMI:  Body mass index is 22.17 kg/(m^2).  Estimated Nutritional Needs:   Kcal:  1800 -1925  Protein:  80-85 gr  Fluid:  >1600 ml daily  EDUCATION NEEDS:   No education needs identified at this time  Colman Cater MS,RD,CSG,LDN Office: [phone] Pager: [phone]

## 2015-12-04 NOTE — Care Management Important Message (Signed)
Important Message  Patient Details  Name: Sophia Ramirez MRN: [id number] Date of Birth: 04/16/1926   Medicare Important Message Given:  Yes    Briant Sites, RN 12/04/2015, 12:14 PM

## 2015-12-04 NOTE — Progress Notes (Signed)
0030 patient woke up and reported that she was hungry. This Probation officer fed patient whole can of vegetable soup and whole vanilla ensure. Patient currently resting quietly in bed.

## 2015-12-04 NOTE — Care Management Important Message (Deleted)
Important Message  Patient Details  Name: Sophia Ramirez MRN: [id number] Date of Birth: 1925/09/04   Medicare Important Message Given:  Yes    Briant Sites, RN 12/04/2015, 12:18 PM

## 2015-12-05 DIAGNOSIS — Z7189 Other specified counseling: Secondary | ICD-10-CM

## 2015-12-05 DIAGNOSIS — E43 Unspecified severe protein-calorie malnutrition: Secondary | ICD-10-CM

## 2015-12-05 DIAGNOSIS — Z515 Encounter for palliative care: Secondary | ICD-10-CM

## 2015-12-05 NOTE — Progress Notes (Signed)
Daily Progress Note   Patient Name: Sophia Ramirez       Date: 12/05/2015 DOB: 1925-08-25  Age: 82 y.o. MRN#: [id number] Attending Physician: Koleen Nimrod Acost* Primary Care Physician: [name], MD Admit Date: 12/02/2015  Reason for Consultation/Follow-up: Disposition, Establishing goals of care, Hospice Evaluation and Psychosocial/spiritual support  Subjective: Mrs. Twedt is resting quietly in bed. No family at bedside at this time. She is lethargic but opens her eyes and communicates with me. She is difficult to understand at times as she is edentulous. She is pleasantly confused, and unable to express more than her basic needs.  Call to son Asheley Tongate home number.  Spoke with his wife Fraser Din, who shares that her husband is at work at this time, but I can try his cell phone. I ask Fraser Din how Mrs. Cassity was doing [facility] skilled nursing facility; she states "in a rage".  We talk about her electrolyte imbalance, and confusion. Also talk about the likelihood that she will continue to have fluid and electrolyte imbalances related to her cancer diagnosis/ illness. We talk about returning to the hospital versus comfort care in the skilled nursing home or hospice home.  Patent states that she was surprised when Mrs. Tinner told the physician that she wanted everything done to keep her alive. Fraser Din states that she had been having signs of self neglect and depression at home.  I share my concern regarding end-of-life for Mrs. Gitchell, and that we can focus on comfort if the family so desires.  Call to son Everlene Iturralde cell number.  He is unable to speak at length at this time, he is at work. We schedule a meeting at bedside for 7/19 at 10 AM. I encourage him to ask his brothers and  sister to attend if they wish.  Length of Stay: 3  Current Medications: Scheduled Meds:  . ALPRAZolam  0.25 mg Oral TID  . heparin  5,000 Units Subcutaneous Q8H  . ramipril  2.5 mg Oral Daily  . sodium chloride flush  3 mL Intravenous Q12H  . verapamil  240 mg Oral Daily    Continuous Infusions: . sodium chloride 100 mL/hr at 12/04/15 2212    PRN Meds: acetaminophen **OR** acetaminophen, haloperidol lactate, ondansetron **OR** ondansetron (ZOFRAN) IV  Physical Exam  Constitutional: No distress.  Chronically ill appearing, thin and frail, temporal wasting  HENT:  Head: Normocephalic.  Right temporal area history of surgery/indentation  Cardiovascular: Normal rate and regular rhythm.   Pulmonary/Chest: Effort normal. No respiratory distress.  Abdominal: Soft. She exhibits no distension.  Neurological: She is alert.  Oriented to person only at this time  Skin: Skin is warm and dry.  Nursing note and vitals reviewed.           Vital Signs: BP 132/77 mmHg  Pulse 62  Temp(Src) 98.2 F (36.8 C) (Oral)  Resp 20  Ht 5\' 2"  (1.575 m)  Wt 55 kg (121 lb 4.1 oz)  BMI 22.17 kg/m2  SpO2 100% SpO2: SpO2: 100 % O2 Device: O2 Device: Not Delivered O2 Flow Rate:    Intake/output summary:  Intake/Output Summary (Last 24 hours) at 12/05/15 1429 Last data filed at 12/04/15 1811  Gross per 24 hour  Intake    240 ml  Output      0 ml  Net    240 ml   LBM: Last BM Date: 12/05/15 Baseline Weight: Weight: 62.596 kg (138 lb) Most recent weight: Weight: 55 kg (121 lb 4.1 oz)       Palliative Assessment/Data:    Flowsheet Rows        Most Recent Value   Intake Tab    Referral Department  Hospitalist   Unit at Time of Referral  Med/Surg Unit   Palliative Care Primary Diagnosis  Cancer   Date Notified  12/02/15   Palliative Care Type  Return patient Palliative Care   Reason for referral  Clarify Goals of Care   Date of Admission  12/02/15   Date first seen by Palliative Care   12/04/15   # of days Palliative referral response time  2 Day(s)   # of days IP prior to Palliative referral  0   Clinical Assessment    Psychosocial & Spiritual Assessment    Palliative Care Outcomes       Patient Active Problem List   Diagnosis Date Noted  . Protein-calorie malnutrition, severe 12/04/2015  . Hematemesis 12/02/2015  . Normocytic anemia 12/02/2015  . Elevated troponin 12/02/2015  . Malignant neoplasm of pancreas ([place])   . Chest pain   . Transaminitis   . Fall   . Hyperbilirubinemia   . Pancreatic cancer ([place])   . Palliative care encounter   . DNR (do not resuscitate) discussion   . Goals of care, counseling/discussion   . Hyponatremia 11/20/2015  . Elevated LFTs   . GERD 01/13/2008  . RENAL CYST 01/13/2008  . NAUSEA WITH VOMITING 01/13/2008  . ABDOMINAL PAIN, UNSPECIFIED SITE 01/13/2008  . CONSTIPATION, HX OF 01/13/2008  . HYPERTENSION 12/23/2007  . 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 WEIGHT LOSS 12/23/2007  . DYSPHAGIA UNSPECIFIED 12/23/2007    Palliative Care Assessment & Plan   Patient Profile: CHOYCE HAVERTY is an 82 y.o. 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 She has no abdominal pain, and her total bili is improving. She returns to the hospital from SNF with electrolyte imbalance, low-sodium.   Assessment: As above   Recommendations/Plan:  Continue to treat the treatable, continue code status discussions. Continue to discuss disposition, including residential SNF with hospice versus hospice home.   Goals of Care and Additional Recommendations:  Limitations on Scope of Treatment: Continue to treat the treatable, BUT NO CHEMOTHERAPY.  Continuous full code at  this time.  Code Status:    Code Status Orders        Start     Ordered   12/02/15 1833  Full code   Continuous     12/02/15 1835    Code Status History    Date Active Date  Inactive Code Status Order ID Comments User Context   11/20/2015  5:29 AM 11/26/2015  7:27 PM Full Code [id number]  Oswald Hillock, MD Inpatient       Prognosis:  < 3 months, likely based on decreased functional status, repeat hospitalizations for fluid/electrolyte imbalance.   Discharge Planning:  To Be Determined, returning to [facility] as resident versus hospice home.  Care plan was discussed with nursing staff, case manager, and Dr. Jerilee Hoh  Thank you for allowing the Palliative Medicine Team to assist in the care of this patient.   Time In: 1140 Time Out: 1215 Total Time 35 minutes  Prolonged Time Billed  no       Greater than 50%  of this time was spent counseling and coordinating care related to the above assessment and plan.  Jailyn Leeson A, NP  Please contact Palliative Medicine Team phone at [phone] for questions and concerns.

## 2015-12-05 NOTE — Progress Notes (Signed)
PROGRESS NOTE    Sophia Ramirez  [id number] DOB: April 06, 1926 DOA: 12/02/2015 PCP: [name], MD     Brief Narrative:  82 year old woman admitted on 7/15 from home with complaints of hematemesis. Patient is encephalopathic and unable to provide any history. She also has been recently diagnosed with pancreatic mass, likely tumor. Palliative care consultation pending, I believe patient would do best in a residential hospice facility.   Assessment & Plan:   Active Problems:   GERD   Hyponatremia   Elevated LFTs   Hyperbilirubinemia   Pancreatic cancer (HCC)   Hematemesis   Normocytic anemia   Elevated troponin   Protein-calorie malnutrition, severe   Pancreatic mass, likely malignant -Diagnosed during prior admission with a CT scan showing a mass at the head of the pancreas. -Patient had ERCP with palliative stent placement. -Discussions between palliative care and family members and prior admission were inconclusive as they were still waiting for other family members to weigh in. -As discussed with my partner on admission, family does not want a biopsy for formal tissue diagnosis or treatment for what is presumed to be a malignancy. -We will again request palliative care consult to address goals of care, believe this patient would most be suited to residential hospice given her rapid decline.  Hematemesis -This was by report from SNF, she did have emesis in the ED without evidence for blood, FOBT was negative in ED, ERCP on 7/4 without dimensions for ulcerations. -Continue Protonix, Carafate, see no reason for GI consultation at this time.  Elevated troponin -Flat, no EKG changes, no further cardiac workup  Hypertension -Well controlled, continue current medications  Hypokalemia -Replaced  Hyponatremia -Improved with fluids, suspect related to long-term decreased oral intake due to her malignancy.   DVT prophylaxis: Subcutaneous heparin Code Status: Full  code Family Communication: None Disposition Plan: Pending palliative care consultation  Consultants:   Palliative care  Procedures:   None  Antimicrobials:   None    Subjective: Drowsy, unable to respond to questions  Objective: Filed Vitals:   12/04/15 1619 12/04/15 1935 12/04/15 2126 12/05/15 0703  BP: 115/49  144/47 132/77  Pulse: 68  66 62  Temp: 98.3 F (36.8 C)  97.7 F (36.5 C) 98.2 F (36.8 C)  TempSrc: Oral  Oral Oral  Resp: 20  20 20   Height:      Weight:      SpO2: 98% 95% 99% 100%    Intake/Output Summary (Last 24 hours) at 12/05/15 1354 Last data filed at 12/04/15 1811  Gross per 24 hour  Intake    240 ml  Output      0 ml  Net    240 ml   Filed Weights   12/02/15 1352 12/02/15 1900  Weight: 62.596 kg (138 lb) 55 kg (121 lb 4.1 oz)    Examination:  General exam: Appears somnolent although fidgety, appears jaundiced Respiratory system: Clear to auscultation. Respiratory effort normal. Cardiovascular system:RRR. No murmurs, rubs, gallops. Gastrointestinal system: Abdomen is distended, soft and nontender. No organomegaly or masses felt. Normal bowel sounds heard. Central nervous system: Unable to assess given current mental state Extremities: No C/C/E, +pedal pulses Skin: No rashes, lesions or ulcers Psychiatry: Unable to assess given current mental state    Data Reviewed: I have personally reviewed following labs and imaging studies  CBC:  Recent Labs Lab 12/02/15 1415 12/03/15 0158 12/03/15 0654  WBC 7.6 8.7 8.9  NEUTROABS 6.0  --   --  HGB 8.7* 9.0* 8.3*  HCT 25.2* 26.9* 24.7*  MCV 86.3 87.1 87.3  PLT 573* 600* [id number]*   Basic Metabolic Panel:  Recent Labs Lab 12/02/15 1412 12/02/15 1415 12/02/15 2045 12/03/15 0654 12/03/15 1941 12/04/15 0652  NA  --  125* 126* 128* 130* 131*  K  --  2.5* 3.3* 3.8 3.9 3.9  CL  --  86* 88* 92* 94* 95*  CO2  --  29 28 30 30 30   GLUCOSE  --  144* 148* 113* 200* 148*  BUN  --  15 14 13  13 15   CREATININE  --  0.52 0.54 0.54 0.65 0.56  CALCIUM  --  8.7* 8.8* 8.6* 8.5* 8.2*  MG 1.4*  --   --   --   --   --    GFR: Estimated Creatinine Clearance: 38.4 mL/min (by C-G formula based on Cr of 0.56). Liver Function Tests:  Recent Labs Lab 12/02/15 1415 12/02/15 2045 12/03/15 0654 12/03/15 1941 12/04/15 0652  AST 102* 102* 83* 74* 60*  ALT 109* 105* 94* 84* 77*  ALKPHOS 294* 286* 271* 249* 250*  BILITOT 6.0* 5.8* 5.2* 4.6* 3.9*  PROT 7.0 6.9 6.3* 6.0* 5.7*  ALBUMIN 2.7* 2.9* 2.5* 2.4* 2.2*    Recent Labs Lab 12/02/15 1415  LIPASE 20    Recent Labs Lab 12/02/15 2045  AMMONIA 30   Coagulation Profile: No results for input(s): INR, PROTIME in the last 168 hours. Cardiac Enzymes:  Recent Labs Lab 12/02/15 1415 12/02/15 2045 12/03/15 0158 12/03/15 0654  TROPONINI 0.05* 0.04* 0.05* 0.04*   BNP (last 3 results) No results for input(s): PROBNP in the last 8760 hours. HbA1C: No results for input(s): HGBA1C in the last 72 hours. CBG: No results for input(s): GLUCAP in the last 168 hours. Lipid Profile: No results for input(s): CHOL, HDL, LDLCALC, TRIG, CHOLHDL, LDLDIRECT in the last 72 hours. Thyroid Function Tests: No results for input(s): TSH, T4TOTAL, FREET4, T3FREE, THYROIDAB in the last 72 hours. Anemia Panel:  Recent Labs  12/02/15 2045 12/03/15 0158  VITAMINB12 1076*  --   FOLATE 9.5  --   FERRITIN 1219*  --   TIBC 255  --   IRON 38  --   RETICCTPCT  --  4.1*   Urine analysis:    Component Value Date/Time   COLORURINE AMBER* 12/02/2015 1835   APPEARANCEUR CLEAR 12/02/2015 1835   LABSPEC 1.010 12/02/2015 1835   PHURINE 7.0 12/02/2015 1835   GLUCOSEU 100* 12/02/2015 1835   HGBUR NEGATIVE 12/02/2015 1835   BILIRUBINUR SMALL* 12/02/2015 1835   KETONESUR NEGATIVE 12/02/2015 1835   PROTEINUR NEGATIVE 12/02/2015 1835   UROBILINOGEN 0.2 02/13/2007 1604   NITRITE NEGATIVE 12/02/2015 1835   LEUKOCYTESUR NEGATIVE 12/02/2015 1835    Sepsis Labs: @LABRCNTIP (procalcitonin:4,lacticidven:4)  ) Recent Results (from the past 240 hour(s))  MRSA PCR Screening     Status: None   Collection Time: 12/02/15  9:29 PM  Result Value Ref Range Status   MRSA by PCR NEGATIVE NEGATIVE Final    Comment:        The GeneXpert MRSA Assay (FDA approved for NASAL specimens only), is one component of a comprehensive MRSA colonization surveillance program. It is not intended to diagnose MRSA infection nor to guide or monitor treatment for MRSA infections.          Radiology Studies: No results found.      Scheduled Meds: . ALPRAZolam  0.25 mg Oral TID  . heparin  5,000 Units  Subcutaneous Q8H  . ramipril  2.5 mg Oral Daily  . sodium chloride flush  3 mL Intravenous Q12H  . verapamil  240 mg Oral Daily   Continuous Infusions: . sodium chloride 100 mL/hr at 12/04/15 2212     LOS: 3 days    Time spent: 25 minutes. Greater than 50% of this time was spent in direct contact with the patient coordinating care.     Lelon Frohlich, MD [facility] Pager [phone]  If 7PM-7AM, please contact night-coverage [email or web address] Password [email or web address] 12/05/2015, 1:54 PM

## 2015-12-06 DIAGNOSIS — C801 Malignant (primary) neoplasm, unspecified: Secondary | ICD-10-CM | POA: Diagnosis present

## 2015-12-06 DIAGNOSIS — K831 Obstruction of bile duct: Secondary | ICD-10-CM

## 2015-12-06 DIAGNOSIS — D649 Anemia, unspecified: Secondary | ICD-10-CM

## 2015-12-06 DIAGNOSIS — I10 Essential (primary) hypertension: Secondary | ICD-10-CM | POA: Diagnosis present

## 2015-12-06 DIAGNOSIS — E876 Hypokalemia: Secondary | ICD-10-CM | POA: Diagnosis not present

## 2015-12-06 LAB — BASIC METABOLIC PANEL
Anion gap: 7 (ref 5–15)
BUN: 7 mg/dL (ref 6–20)
CHLORIDE: 95 mmol/L — AB (ref 101–111)
CO2: 31 mmol/L (ref 22–32)
Calcium: 7.7 mg/dL — ABNORMAL LOW (ref 8.9–10.3)
Creatinine, Ser: 0.48 mg/dL (ref 0.44–1.00)
GFR calc Af Amer: >60 mL/min (ref >60)
GFR calc non Af Amer: >60 mL/min (ref >60)
Glucose, Bld: 121 mg/dL — ABNORMAL HIGH (ref 65–99)
POTASSIUM: 2.6 mmol/L — AB (ref 3.5–5.1)
SODIUM: 133 mmol/L — AB (ref 135–145)

## 2015-12-06 MED ORDER — POTASSIUM CHLORIDE CRYS ER 20 MEQ PO TBCR
20.0000 meq | EXTENDED_RELEASE_TABLET | Freq: Three times a day (TID) | ORAL | Status: DC
  Administered 2015-12-06: 20 meq via ORAL
  Filled 2015-12-06: qty 1

## 2015-12-06 MED ORDER — MORPHINE SULFATE (CONCENTRATE) 10 MG/0.5ML PO SOLN: 2.5000 mg | ORAL | Status: DC | PRN

## 2015-12-06 MED ORDER — BENZONATATE 100 MG PO CAPS: 100.0000 mg | ORAL_CAPSULE | Freq: Three times a day (TID) | ORAL | Status: DC | PRN

## 2015-12-06 MED ORDER — POTASSIUM CHLORIDE CRYS ER 20 MEQ PO TBCR
20.0000 meq | EXTENDED_RELEASE_TABLET | Freq: Two times a day (BID) | ORAL | Status: DC
  Administered 2015-12-06: 20 meq via ORAL
  Filled 2015-12-06: qty 1

## 2015-12-06 MED ORDER — PANTOPRAZOLE SODIUM 40 MG PO TBEC: 40.0000 mg | DELAYED_RELEASE_TABLET | Freq: Every day | ORAL | Status: DC

## 2015-12-06 MED ORDER — POTASSIUM CHLORIDE IN NACL 20-0.9 MEQ/L-% IV SOLN
INTRAVENOUS | Status: DC
  Administered 2015-12-06: 09:00:00 via INTRAVENOUS

## 2015-12-06 NOTE — Care Management Important Message (Signed)
Important Message  Patient Details  Name: Sophia Ramirez MRN: [id number] Date of Birth: 04-07-1926   Medicare Important Message Given:  Yes    Alvie Heidelberg, RN 12/06/2015, 8:54 AM

## 2015-12-06 NOTE — Progress Notes (Addendum)
Daily Progress Note   Patient Name: Sophia Ramirez       Date: 12/06/2015 DOB: 03-10-1926  Age: 82 y.o. MRN#: [id number] Attending Physician: Rexene Alberts, MD Primary Care Physician: [name], MD Admit Date: 12/02/2015  Reason for Consultation/Follow-up: Disposition, Establishing goals of care, Hospice Evaluation and Psychosocial/spiritual support  Subjective: Call from Sophia Ramirez son Sophia Ramirez around 08 30. He shares that he and his family will not be able to meet for our schedule 10 AM meeting. We discussed the seriousness of his mother's health. I share my worry over her being unable to manage her electrolyte balance. I encourage him to be a spokesperson for his family (they have stated in the past that they are accepting of his guidance).  Later in the afternoon i meet with, daughter Sophia Ramirez, present also is Ms. Chesney son Sophia Ramirez. We talk about Sophia Ramirez's chronic illnesses. Burnell shares that family has decided to focus on comfort, and they wish for her to be transferred to the hospice home of [facility]. We talk about unburdening her from taking bitter medications, and untethering her from IV fluids and antibiotics. They are in agreement to focus on comfort only aware that their mother will likely have only weeks left. We talk about offering her medications for comfort and any food or drink that she would request. We talk about code status, and the realities of chest compressions. Burnell and her brothers are in agreement for no CPR, but she shares that Sophia Ramirez may feel upset if we don't do "everything".  I talk with Sophia Ramirez about the realities of CPR and he states that he is going to lean on the knowledge of the medical staff, and abide with his siblings  decisions.  Length of Stay: 4  Current Medications: Scheduled Meds:  . ALPRAZolam  0.25 mg Oral TID  . heparin  5,000 Units Subcutaneous Q8H  . potassium chloride  20 mEq Oral BID  . ramipril  2.5 mg Oral Daily  . sodium chloride flush  3 mL Intravenous Q12H  . verapamil  240 mg Oral Daily    Continuous Infusions: . 0.9 % NaCl with KCl 20 mEq / L 50 mL/hr at 12/06/15 1100    PRN Meds: acetaminophen **OR** acetaminophen, haloperidol lactate, ondansetron **OR** ondansetron (ZOFRAN) IV  Physical  Exam  Constitutional: No distress.  HENT:  History of trauma to right temple, generalized frailty, temporal wasting  Pulmonary/Chest: No respiratory distress.  Abdominal: Soft. She exhibits no distension. There is no guarding.  Neurological: She is alert.  Unable to determine orientation  Skin: Skin is warm and dry.  Strong odor of urine around patient, even after bathing. UA negative  Nursing note and vitals reviewed.           Vital Signs: BP 175/68 mmHg  Pulse 58  Temp(Src) 98.9 F (37.2 C) (Oral)  Resp 20  Ht 5\' 2"  (1.575 m)  Wt 55 kg (121 lb 4.1 oz)  BMI 22.17 kg/m2  SpO2 99% SpO2: SpO2: 99 % O2 Device: O2 Device: Not Delivered O2 Flow Rate:    Intake/output summary:  Intake/Output Summary (Last 24 hours) at 12/06/15 1259 Last data filed at 12/06/15 1100  Gross per 24 hour  Intake    800 ml  Output      0 ml  Net    800 ml   LBM: Last BM Date: 12/05/15 Baseline Weight: Weight: 62.596 kg (138 lb) Most recent weight: Weight: 55 kg (121 lb 4.1 oz)       Palliative Assessment/Data:    Flowsheet Rows        Most Recent Value   Intake Tab    Referral Department  Hospitalist   Unit at Time of Referral  Med/Surg Unit   Palliative Care Primary Diagnosis  Cancer   Date Notified  12/02/15   Palliative Care Type  Return patient Palliative Care   Reason for referral  Clarify Goals of Care   Date of Admission  12/02/15   Date first seen by Palliative Care   12/04/15   # of days Palliative referral response time  2 Day(s)   # of days IP prior to Palliative referral  0   Clinical Assessment    Psychosocial & Spiritual Assessment    Palliative Care Outcomes       Patient Active Problem List   Diagnosis Date Noted  . Protein-calorie malnutrition, severe 12/04/2015  . Hematemesis 12/02/2015  . Normocytic anemia 12/02/2015  . Elevated troponin 12/02/2015  . Malignant neoplasm of pancreas ([place])   . Chest pain   . Transaminitis   . Fall   . Hyperbilirubinemia   . Pancreatic cancer ([place])   . Palliative care encounter   . DNR (do not resuscitate) discussion   . Goals of care, counseling/discussion   . Hyponatremia 11/20/2015  . Elevated LFTs   . GERD 01/13/2008  . RENAL CYST 01/13/2008  . NAUSEA WITH VOMITING 01/13/2008  . ABDOMINAL PAIN, UNSPECIFIED SITE 01/13/2008  . CONSTIPATION, HX OF 01/13/2008  . HYPERTENSION 12/23/2007  . 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 WEIGHT LOSS 12/23/2007  . DYSPHAGIA UNSPECIFIED 12/23/2007    Palliative Care Assessment & Plan   Patient Profile: Sophia Ramirez is an 82 y.o. 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 She has no abdominal pain, and her total bili is improving. She returns to the hospital from SNF with electrolyte imbalance, low-sodium.   Assessment: As above   Recommendations/Plan:  Continue to treat the treatable, continue code status discussions. Continue to discuss disposition, including residential SNF with hospice versus hospice home.  Goals of Care and Additional Recommendations:  Limitations on Scope of Treatment: Full Comfort Care    Code Status:    Code Status  Orders        Start     Ordered   12/02/15 1833  Full code   Continuous     12/02/15 1835    Code Status History    Date Active Date Inactive Code Status Order ID Comments User Context   11/20/2015  5:29 AM  11/26/2015  7:27 PM Full Code [id number]  Oswald Hillock, MD Inpatient       Prognosis:  < 2 weeks, Quite likely, based on cancer burden, inability to regulate electrolytes, and families desire to focus on comfort only.   Discharge Planning:  Hosp[facility] home of [place] was discussed with Nursing staff, case manager, and Dr. Caryn Section.  Thank you for allowing the Palliative Medicine Team to assist in the care of this patient.   Time In: 1445 Time Out: 1540 Total Time 55 minutes  Prolonged Time Billed  yes       Greater than 50%  of this time was spent counseling and coordinating care related to the above assessment and plan.  Wood Novacek A, NP  Please contact Palliative Medicine Team phone at [phone] for questions and concerns.

## 2015-12-06 NOTE — NC FL2 (Signed)
[place] MEDICAID FL2 LEVEL OF CARE SCREENING TOOL     IDENTIFICATION  Patient Name: Sophia Ramirez Birthdate: 1925/09/21 Sex: female Admission Date (Current Location): 12/02/2015  [facility] and [place] Number:  [employer] and Address:  [place] [address] [place]      Provider Number: [id number]  Attending Physician Name and Address:  Rexene Alberts, MD  Relative Name and Phone Number:       Current Level of Care: Hospital Recommended Level of Care:   Prior Approval Number:    Date Approved/Denied:   PASRR Number:  ([id number])  Discharge Plan: SNF    Current Diagnoses: Patient Active Problem List   Diagnosis Date Noted  . Protein-calorie malnutrition, severe 12/04/2015  . Hematemesis 12/02/2015  . Normocytic anemia 12/02/2015  . Elevated troponin 12/02/2015  . Malignant neoplasm of pancreas ([place])   . Chest pain   . Transaminitis   . Fall   . Hyperbilirubinemia   . Pancreatic cancer ([place])   . Palliative care encounter   . DNR (do not resuscitate) discussion   . Goals of care, counseling/discussion   . Hyponatremia 11/20/2015  . Elevated LFTs   . GERD 01/13/2008  . RENAL CYST 01/13/2008  . NAUSEA WITH VOMITING 01/13/2008  . ABDOMINAL PAIN, UNSPECIFIED SITE 01/13/2008  . CONSTIPATION, HX OF 01/13/2008  . HYPERTENSION 12/23/2007  . 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 WEIGHT LOSS 12/23/2007  . DYSPHAGIA UNSPECIFIED 12/23/2007    Orientation RESPIRATION BLADDER Height & Weight     Self  Normal Incontinent Weight: 121 lb 4.1 oz (55 kg) Height:  5\' 2"  (157.5 cm)  BEHAVIORAL SYMPTOMS/MOOD NEUROLOGICAL BOWEL NUTRITION STATUS      Continent Diet (Regular)  AMBULATORY STATUS COMMUNICATION OF NEEDS Skin   Extensive Assist (mainly uses wheelchair. ) Verbally Normal                       Personal Care Assistance Level of Assistance  Bathing, Feeding, Dressing Bathing Assistance: Limited assistance Feeding  assistance: Limited assistance Dressing Assistance: Limited assistance     Functional Limitations Info  Sight, Hearing, Speech Sight Info: Adequate Hearing Info: Adequate Speech Info: Adequate    SPECIAL CARE FACTORS FREQUENCY                       Contractures      Additional Factors Info  Code Status Code Status Info: Full Code Allergies Info: n/a Psychotropic Info: Xanax          Current Medications (12/06/2015):  This is the current hospital active medication list Current Facility-Administered Medications  Medication Dose Route Frequency Provider Last Rate Last Dose  . 0.9 % NaCl with KCl 20 mEq/ L  infusion   Intravenous Continuous Rexene Alberts, MD 50 mL/hr at 12/06/15 1100    . acetaminophen (TYLENOL) tablet 650 mg  650 mg Oral Q6H PRN Waldemar Dickens, MD       Or  . acetaminophen (TYLENOL) suppository 650 mg  650 mg Rectal Q6H PRN Waldemar Dickens, MD      . ALPRAZolam Duanne Moron) tablet 0.25 mg  0.25 mg Oral TID Waldemar Dickens, MD   0.25 mg at 12/06/15 0910  . haloperidol lactate (HALDOL) injection 2 mg  2 mg Intramuscular Q6H PRN Waldemar Dickens, MD   2 mg at 12/03/15 0304  . heparin injection 5,000 Units  5,000 Units Subcutaneous Q8H Waldemar Dickens, MD  5,000 Units at 12/06/15 0636  . ondansetron (ZOFRAN) tablet 4 mg  4 mg Oral Q6H PRN Waldemar Dickens, MD       Or  . ondansetron [facility]) injection 4 mg  4 mg Intravenous Q6H PRN Waldemar Dickens, MD      . potassium chloride SA (K-DUR,KLOR-CON) CR tablet 20 mEq  20 mEq Oral BID Rexene Alberts, MD   20 mEq at 12/06/15 0910  . ramipril (ALTACE) capsule 2.5 mg  2.5 mg Oral Daily Waldemar Dickens, MD   2.5 mg at 12/06/15 0910  . sodium chloride flush (NS) 0.9 % injection 3 mL  3 mL Intravenous Q12H Waldemar Dickens, MD   3 mL at 12/06/15 0911  . verapamil (CALAN-SR) CR tablet 240 mg  240 mg Oral Daily Waldemar Dickens, MD   240 mg at 12/06/15 [id number]     Discharge Medications: Please see discharge summary for a list of  discharge medications.  Relevant Imaging Results:  Relevant Lab Results:   Additional Information    Amed Datta, Clydene Pugh, LCSW

## 2015-12-06 NOTE — Progress Notes (Signed)
CRITICAL VALUE ALERT  Critical value received: 2.6 potassium Date of notification:  12/06/15  Time of notification: [id number] Critical value read back:Yes.    Nurse who received alert:  R. Everrett  MD notified (1st page): [phone] Time of first page: 0715  MD notified (2nd page):  Time of second page:  Responding MD: Dr. Caryn Section  Time MD responded:  [phone]

## 2015-12-06 NOTE — Progress Notes (Signed)
PROGRESS NOTE    Sophia Ramirez  KZS:[id number] DOB: 1925/09/06 DOA: 12/02/2015 PCP: [name], MD    Brief Narrative:  Patient is an 82 year old woman with a history of dementia, hypertension, and arthritis. She was also recently hospitalized 2 weeks ago for biliary obstruction felt to be secondary to a pancreatic head mass measuring 3.1 x 3.3 cm. There were also right lower lobe and hepatic metastasis. The patient underwent a palliative ERCP and stent placement by Dr. [name] on 11/21/2015. She presented on 12/02/15 from [facility] with a report of hematemesis. She was hemodynamically stable in the ED. Her lab data were significant for a serum sodium of 126, potassium of 3.3, elevated LFTs, and marginally elevated troponin I of 0.04. Her hemoglobin was 8.7. She was admitted for further evaluation and management.   Assessment & Plan:   Principal Problem:   Pancreatic cancer ([place]) Active Problems:   Malignant neoplasm of pancreas (HCC)   Biliary obstruction due to cancer   GERD   Hyponatremia   Elevated LFTs   Hyperbilirubinemia   Hematemesis   Normocytic anemia   Elevated troponin   Protein-calorie malnutrition, severe   Hypokalemia   Essential hypertension   1. Reported hematemesis. In the ED, patient apparently had emesis, but without hematemesis. Her stool was guaiac negative. However, her ERCP did reveal some esophagitis.  -We'll start Protonix empirically. -Patient has no complaints of nausea vomiting or hematemesis. Will discontinue subcutaneous heparin in favor of SCDs.  Pancreatic mass with biliary obstruction, pancreatic cancer with metastasis until proven otherwise. Patient is status post palliative ERCP and stent placement on 11/21/15. During the previous hospitalization, palliative care was consulted, but no firm decision was made. -Palliative care has been consulted. Per my conversation with NP, Ms. Dove, she has met with the family again and they are agreeing to  DO NOT RESUSCITATE and transition to hospice. -Also discussed the patient's poor prognosis with her son and her daughter. I informed them that I was in agreement with the DO NOT RESUSCITATE status and transition to comfort care in the next 24 hours and hopefully, she could be transferred to hospice home. I believe her prognosis is less than 2 months.  Elevated LFTs and hyperbilirubinemia, secondary to the pancreatic cancer. -Continue supportive treatment.  Hyponatremia. Patient serum sodium was 126 on admission. She was started on normal saline infusion. Her serum sodium has improved. We will decrease the IV fluid rate.  Hypokalemia. Patient's serum potassium was within normal limits on admission, but with IV fluids without potassium added, it has fallen to 2.6. She was started on potassium chloride.  Elevated troponin I. Patient's troponin I was mentally elevated at 0.04. No further investigational studies are needed  Anemia secondary to chronic disease-pancreatic malignancy. Anemia panel ordered and was essentially within normal limits.  Essential hypertension. Patient was restarted on verapamil and ramipril. Her blood pressure had been moderately elevated, but is improving.       DVT prophylaxis: Subcutaneous heparin Code Status: DO NOT RESUSCITATE Family Communication: Discussed briefly with daughter and son Disposition Plan: Possible discharge to hospice home versus SNF with hospice   Consultants:   Palliative care  Procedures:   None  Antimicrobials:  None   Subjective: Patient has no complaints of nausea, vomiting, or abdominal pain at this time.  Objective: Filed Vitals:   12/05/15 1701 12/05/15 2218 12/05/15 2229 12/06/15 0627  BP: 164/59  185/73 175/68  Pulse: 81  99 58  Temp: 98.6 F (37  C)  99.3 F (37.4 C) 98.9 F (37.2 C)  TempSrc: Oral  Oral Oral  Resp: 20     Height:      Weight:      SpO2: 100% 99% 100% 99%    Intake/Output Summary  (Last 24 hours) at 12/06/15 1307 Last data filed at 12/06/15 1100  Gross per 24 hour  Intake    800 ml  Output      0 ml  Net    800 ml   Filed Weights   12/02/15 1352 12/02/15 1900  Weight: 62.596 kg (138 lb) 55 kg (121 lb 4.1 oz)    Examination:  General exam: Appears calm and comfortable; elderly 83 year old African-American woman in no acute distress.  Respiratory system: She by basilar crackles; breathing unlabored at rest. Cardiovascular system: S1 & S2 heard, RRR. No JVD, murmurs, rubs, gallops or clicks. No pedal edema. Gastrointestinal system: Abdomen is nondistended, soft and with mild epigastric tenderness. No organomegaly or masses felt. Normal bowel sounds heard. Central nervous system: Alert and oriented to herself, daughter, and hospital. No focal neurological deficits. Extremities: No acute hot red joints. Skin: No rashes, lesions or ulcers Psychiatry: Speech is clear. Pleasant affect.    Data Reviewed: I have personally reviewed following labs and imaging studies  CBC:  Recent Labs Lab 12/02/15 1415 12/03/15 0158 12/03/15 0654  WBC 7.6 8.7 8.9  NEUTROABS 6.0  --   --   HGB 8.7* 9.0* 8.3*  HCT 25.2* 26.9* 24.7*  MCV 86.3 87.1 87.3  PLT 573* 600* 747*   Basic Metabolic Panel:  Recent Labs Lab 12/02/15 1412  12/02/15 2045 12/03/15 0654 12/03/15 1941 12/04/15 0652 12/06/15 0552  NA  --   < > 126* 128* 130* 131* 133*  K  --   < > 3.3* 3.8 3.9 3.9 2.6*  CL  --   < > 88* 92* 94* 95* 95*  CO2  --   < > _0 GLUCOSE  --   < > 148* 113* 200* 148* 121*  BUN  --   < > _1 CREATININE  --   < > 0.54 0.54 0.65 0.56 0.48  CALCIUM  --   < > 8.8* 8.6* 8.5* 8.2* 7.7*  MG 1.4*  --   --   --   --   --   --   < > = values in this interval not displayed. GFR: Estimated Creatinine Clearance: 38.4 mL/min (by C-G formula based on Cr of 0.48). Liver Function Tests:  Recent Labs Lab 12/02/15 1415 12/02/15 2045 12/03/15 0654  12/03/15 1941 12/04/15 0652  AST 102* 102* 83* 74* 60*  ALT 109* 105* 94* 84* 77*  ALKPHOS 294* 286* 271* 249* 250*  BILITOT 6.0* 5.8* 5.2* 4.6* 3.9*  PROT 7.0 6.9 6.3* 6.0* 5.7*  ALBUMIN 2.7* 2.9* 2.5* 2.4* 2.2*    Recent Labs Lab 12/02/15 1415  LIPASE 20    Recent Labs Lab 12/02/15 2045  AMMONIA 30   Coagulation Profile: No results for input(s): INR, PROTIME in the last 168 hours. Cardiac Enzymes:  Recent Labs Lab 12/02/15 1415 12/02/15 2045 12/03/15 0158 12/03/15 0654  TROPONINI 0.05* 0.04* 0.05* 0.04*   BNP (last 3 results) No results for input(s): PROBNP in the last 8760 hours. HbA1C: No results for input(s): HGBA1C in the last 72 hours. CBG: No results for input(s): GLUCAP in the last 168 hours. Lipid Profile: No results for input(s):  CHOL, HDL, LDLCALC, TRIG, CHOLHDL, LDLDIRECT in the last 72 hours. Thyroid Function Tests: No results for input(s): TSH, T4TOTAL, FREET4, T3FREE, THYROIDAB in the last 72 hours. Anemia Panel: No results for input(s): VITAMINB12, FOLATE, FERRITIN, TIBC, IRON, RETICCTPCT in the last 72 hours. Sepsis Labs: No results for input(s): PROCALCITON, LATICACIDVEN in the last 168 hours.  Recent Results (from the past 240 hour(s))  MRSA PCR Screening     Status: None   Collection Time: 12/02/15  9:29 PM  Result Value Ref Range Status   MRSA by PCR NEGATIVE NEGATIVE Final    Comment:        The GeneXpert MRSA Assay (FDA approved for NASAL specimens only), is one component of a comprehensive MRSA colonization surveillance program. It is not intended to diagnose MRSA infection nor to guide or monitor treatment for MRSA infections.          Radiology Studies: No results found.      Scheduled Meds: . ALPRAZolam  0.25 mg Oral TID  . heparin  5,000 Units Subcutaneous Q8H  . potassium chloride  20 mEq Oral TID  . ramipril  2.5 mg Oral Daily  . sodium chloride flush  3 mL Intravenous Q12H  . verapamil  240 mg Oral  Daily   Continuous Infusions: . 0.9 % NaCl with KCl 20 mEq / L 50 mL/hr at 12/06/15 1100     LOS: 4 days    Time spent: 53 minutes    Rexene Alberts, MD [facility] Pager [phone]  If 7PM-7AM, please contact night-coverage [email or web address] Password [email or web address] 12/06/2015, 1:07 PM

## 2015-12-07 MED ORDER — BENZONATATE 100 MG PO CAPS: 100.0000 mg | ORAL_CAPSULE | Freq: Three times a day (TID) | ORAL | Status: AC | PRN

## 2015-12-07 MED ORDER — MORPHINE SULFATE (CONCENTRATE) 10 MG/0.5ML PO SOLN: 2.5000 mg | ORAL | Status: AC | PRN

## 2015-12-07 NOTE — Progress Notes (Signed)
Daily Progress Note   Patient Name: Sophia Ramirez       Date: 12/07/2015 DOB: June 26, 1925  Age: 82 y.o. MRN#: [id number] Attending Physician: Rexene Alberts, MD Primary Care Physician: [name], MD Admit Date: 12/02/2015  Reason for Consultation/Follow-up: Establishing goals of care and Psychosocial/spiritual support  Subjective: daughter Sophia Ramirez is in Sophia Ramirez room. We walk outside into the hallway to talk. She shares that she has talked with case manager, and knows that her mother will be transferred via EMS to hospice home in [place] later this afternoon.  She states that her mother tells her that she doesn't want to go to [place]. Sophia Ramirez is tearful about this. I share with her that I will talk with her mother about the benefits and the beauty of the hospice home.  Sophia Ramirez is resting quietly in bed. She greets me and makes eye contact as I enter. She shares with me, "I don't want to go to [place]". I ask her why. She shares that she just doesn't want to go. We talk about the [facility] hospice facility, we talk about love and dignity being provided in that facility. I share that I know she wants to go home, but her family is unable to accommodate this. Her sister walks in the room, and Sophia Ramirez. Sophia Ramirez sister asks about her transfer, and I share that she will be transferred to the hospice home of [place] later today. Sophia Ramirez sisters very supportive stating, "we will be there with you".  Length of Stay: 5  Current Medications: Scheduled Meds:  . ALPRAZolam  0.25 mg Oral TID  . sodium chloride flush  3 mL Intravenous Q12H  . verapamil  240 mg Oral Daily    Continuous Infusions: . 0.9 % NaCl with KCl 20 mEq / L 10 mL/hr  at 12/06/15 1815    PRN Meds: acetaminophen **OR** acetaminophen, benzonatate, haloperidol lactate, morphine CONCENTRATE, ondansetron **OR** ondansetron (ZOFRAN) IV  Physical Exam  Constitutional: No distress.  Cardiovascular: Normal rate.   Pulmonary/Chest: Effort normal. No respiratory distress.  Abdominal: Soft. She exhibits no distension.  Neurological: She is alert.  Skin: Skin is warm and dry.  Nursing note and vitals reviewed.           Vital Signs: BP 133/62 mmHg  Pulse 76  Temp(Src) 98.2 F (36.8 C) (Oral)  Resp 20  Ht 5\' 2"  (1.575 m)  Wt 55 kg (121 lb 4.1 oz)  BMI 22.17 kg/m2  SpO2 100% SpO2: SpO2: 100 % O2 Device: O2 Device: Not Delivered O2 Flow Rate:    Intake/output summary:  Intake/Output Summary (Last 24 hours) at 12/07/15 1520 Last data filed at 12/07/15 0900  Gross per 24 hour  Intake    480 ml  Output      0 ml  Net    480 ml   LBM: Last BM Date: 12/05/15 Baseline Weight: Weight: 62.596 kg (138 lb) Most recent weight: Weight: 55 kg (121 lb 4.1 oz)       Palliative Assessment/Data:    Flowsheet Rows        Most Recent Value   Intake Tab    Referral Department  Hospitalist   Unit at Time of Referral  Med/Surg Unit   Palliative Care Primary Diagnosis  Cancer   Date Notified  12/02/15   Palliative Care Type  Return patient Palliative Care   Reason for referral  Clarify Goals of Care   Date of Admission  12/02/15   Date first seen by Palliative Care  12/04/15   # of days Palliative referral response time  2 Day(s)   # of days IP prior to Palliative referral  0   Clinical Assessment    Psychosocial & Spiritual Assessment    Palliative Care Outcomes       Patient Active Problem List   Diagnosis Date Noted  . Biliary obstruction due to cancer 12/06/2015  . Hypokalemia 12/06/2015  . Essential hypertension 12/06/2015  . Protein-calorie malnutrition, severe 12/04/2015  . Hematemesis 12/02/2015  . Normocytic anemia 12/02/2015  . Elevated  troponin 12/02/2015  . Malignant neoplasm of pancreas ([place])   . Chest pain   . Transaminitis   . Fall   . Hyperbilirubinemia   . Pancreatic cancer ([place])   . Palliative care encounter   . DNR (do not resuscitate) discussion   . Goals of care, counseling/discussion   . Hyponatremia 11/20/2015  . Elevated LFTs   . GERD 01/13/2008  . RENAL CYST 01/13/2008  . NAUSEA WITH VOMITING 01/13/2008  . ABDOMINAL PAIN, UNSPECIFIED SITE 01/13/2008  . CONSTIPATION, HX OF 01/13/2008  . HYPERTENSION 12/23/2007  . 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 WEIGHT LOSS 12/23/2007  . DYSPHAGIA UNSPECIFIED 12/23/2007    Palliative Care Assessment & Plan   Patient Profile: Sophia Ramirez is an 82 y.o. 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 She has no abdominal pain, and her total bili is improving. She returns to the hospital from SNF with electrolyte imbalance, low-sodium.   Assessment: As above   Recommendations/Plan:  Continue to treat the treatable, continue code status discussions. Continue to discuss disposition, including residential SNF with hospice versus hospice home.  Goals of Care and Additional Recommendations:  Limitations on Scope of Treatment: Full Comfort Care  Code Status:    Code Status Orders        Start     Ordered   12/06/15 1615  Do not attempt resuscitation (DNR)   Continuous    Question Answer Comment  In the event of cardiac or respiratory ARREST Do not call a "code blue"   In the event of cardiac or respiratory ARREST Do not perform Intubation, CPR, defibrillation or ACLS   In the  event of cardiac or respiratory ARREST Use medication by any route, position, wound care, and other measures to relive pain and suffering. May use oxygen, suction and manual treatment of airway obstruction as needed for comfort.      12/06/15 1614    Code Status History    Date Active Date  Inactive Code Status Order ID Comments User Context   12/02/2015  6:35 PM 12/06/2015  4:14 PM Full Code [id number]  Waldemar Dickens, MD ED   11/20/2015  5:29 AM 11/26/2015  7:27 PM Full Code [id number]  Oswald Hillock, MD Inpatient       Prognosis:   < 2 weeks  Discharge Planning:  Hospice home of [place] was discussed with nursing staff, CM, SW, and Dr. Caryn Section.   Thank you for allowing the Palliative Medicine Team to assist in the care of this patient.   Time In: 1310 Time Out: 1330 Total Time 20 minutes  Prolonged Time Billed  no       Greater than 50%  of this time was spent counseling and coordinating care related to the above assessment and plan.  Dove,Tasha A, NP  Please contact Palliative Medicine Team phone at [phone] for questions and concerns.

## 2015-12-07 NOTE — Consult Note (Signed)
   [facility] CM Inpatient Consult   12/07/2015  Sophia Ramirez [birth year] [id number]   Patient screened for potential [place] Management services. Patient is eligible for [place]. Electronic medical record reveals patient's discharge plan is for Hospice services, there were no identifiable [facility] care management needs at this time. [facility] Care Management services not appropriate at this time. If patient's post hospital needs change please place a [facility] Care Management consult.  For questions please contact:   Royetta Crochet. Laymond Purser, RN, BSN, [place] [phone]) Business Cell  [phone]) Toll Free Office

## 2015-12-07 NOTE — Discharge Summary (Signed)
Physician Discharge Summary  [name] IDP:[id number] DOB: 20-Aug-1925 DOA: 12/02/2015  PCP: [name], MD  Admit date: 12/02/2015 Discharge date: 12/07/2015  Time spent: Greater than 30 minutes  Recommendations for Outpatient Follow-up:  1. Patient was discharged to hospice home of [facility]. Symptom management per hospice protocol.     Discharge Diagnoses:  Principal Problem:   Pancreatic cancer [facility]) Active Problems: -Transitioned to comfort care.   Malignant neoplasm of pancreas (HCC)   Biliary obstruction due to cancer   GERD   Hyponatremia   Elevated LFTs   Hyperbilirubinemia   Hematemesis   Normocytic anemia   Elevated troponin   Protein-calorie malnutrition, severe   Hypokalemia   Essential hypertension   Discharge Condition: Stable but terminal  Diet recommendation: Comfort feeding as tolerated.  Filed Weights   12/02/15 1352 12/02/15 1900  Weight: 62.596 kg (138 lb) 55 kg (121 lb 4.1 oz)    History of present illness:  Patient is an 82 year old woman with a history of dementia, hypertension, and arthritis. She was also recently hospitalized 2 weeks ago for biliary obstruction felt to be secondary to a pancreatic head mass measuring 3.1 x 3.3 cm. There were also right lower lobe and hepatic metastasis. The patient underwent a palliative ERCP and stent placement by Dr. [name] on 11/21/2015. She presented on 12/02/15 from [facility] with a report of hematemesis. She was hemodynamically stable in the ED. Her lab data were significant for a serum sodium of 126, potassium of 3.3, elevated LFTs, and marginally elevated troponin I of 0.04. Her hemoglobin was 8.7. She was admitted for further evaluation and management.  Hospital Course:   Pancreatic mass with biliary obstruction, pancreatic cancer with metastasis until proven otherwise. Patient is status post palliative ERCP and stent placement on 11/21/15. During the previous hospitalization, palliative  care was consulted, but no firm decision was made. Due to the patient's poor prognosis, likely less than one month, I gently recommended to the patient's daughter and son, a DO NOT RESUSCITATE status and transitioning her to hospice and comfort care management. Palliative care was re-consulted. NP, Ms. [name] met with the family on a number of occasions and following their conversation, the patient was made a DO NOT RESUSCITATE with the management changed to comfort care. The family requested that she be discharged to hospice home. All medications not conducive to comfort were discontinued.  Reported hematemesis. In the ED, patient apparently had emesis, but without hematemesis. Her stool was guaiac negative. However, her ERCP did reveal some esophagitis. Protonix was going to be ordered, but it was not given because she had no evidence of hematemesis during the hospitalization.  Zofran as needed was ordered for nausea.  Hyponatremia. Patient's serum sodium was 126 on admission. She was started on normal saline infusion. Her serum sodium improved. Hypokalemia. Patient's serum potassium was within normal limits on admission, but with IV fluids without potassium added, it had fallen to 2.6. She was started on potassium chloride.KCl supplement was discontinued at the time of discharge due to comfort care management. Elevated troponin I. Patient's troponin I was mildly  elevated at 0.04. No further investigational studieswere ordered. Anemia secondary to chronic disease-pancreatic malignancy.  patient's hemoglobin ranged from 8.3-9.0. Anemia panel ordered and was essentially within normal limits. Essential hypertension. Patient was restarted on verapamil and ramipril. Her blood pressure had been moderately elevated, but improved. Antihypertensive medications were discontinued at the time of discharge due to comfort care management.  Procedures:  None  Consultations:  Palliative care  Discharge  Exam: Filed Vitals:   12/06/15 2214 12/07/15 0622  BP: 142/80 133/62  Pulse: 86 76  Temp: 98 F (36.7 C) 98.2 F (36.8 C)  Resp: 20 20    General: Frail elderly 82 year old African-American woman in no acute distress. Cardiovascular: S1, S2, no murmurs rubs or gallops. No pedal edema. Respiratory: Bibasilar crackles; breathing nonlabored. Abdomen: Positive bowel sounds, soft, mild epigastric tenderness without distention.  Discharge Instructions   Discharge Instructions    Diet general    Complete by:  As directed      Increase activity slowly    Complete by:  As directed           Current Discharge Medication List    START taking these medications   Details  benzonatate (TESSALON) 100 MG capsule Take 1 capsule (100 mg total) by mouth 3 (three) times daily as needed for cough. Qty: 20 capsule, Refills: 0    Morphine Sulfate (MORPHINE CONCENTRATE) 10 MG/0.5ML SOLN concentrated solution Take 0.13 mLs (2.6 mg total) by mouth every 2 (two) hours as needed for moderate pain or severe pain (dyspnea).      CONTINUE these medications which have NOT CHANGED   Details  acetaminophen (TYLENOL) 500 MG tablet Take 500 mg by mouth every 6 (six) hours as needed for pain.    ALPRAZolam (XANAX) 0.25 MG tablet Take 0.25 mg by mouth 3 (three) times daily.      STOP taking these medications     ramipril (ALTACE) 2.5 MG tablet      verapamil (CALAN-SR) 240 MG CR tablet        No Known Allergies    The results of significant diagnostics from this hospitalization (including imaging, microbiology, ancillary and laboratory) are listed below for reference.    Significant Diagnostic Studies: Dg Chest 1 View  11/20/2015  CLINICAL DATA:  Patient found on floor.  Initial encounter. EXAM: CHEST 1 VIEW COMPARISON:  Chest radiograph performed 07/12/2006 FINDINGS: The lungs are well-aerated and clear. There is no evidence of focal opacification, pleural effusion or pneumothorax. The  cardiomediastinal silhouette is borderline normal in size. No acute osseous abnormalities are seen. Mild degenerative change is noted at the left glenohumeral joint. IMPRESSION: No acute cardiopulmonary process seen. Electronically Signed   By: Garald Balding M.D.   On: 11/20/2015 02:46   Dg Chest 2 View  12/02/2015  CLINICAL DATA:  Vomiting. EXAM: CHEST  2 VIEW COMPARISON:  Radiograph of November 20, 2015. FINDINGS: The heart size and mediastinal contours are within normal limits. Both lungs are clear. No pneumothorax or pleural effusion is noted. Small hiatal hernia is noted. Degenerative changes are noted in the lower thoracic spine. Aortic atherosclerosis is noted. IMPRESSION: No active cardiopulmonary disease.  Aortic atherosclerosis. Electronically Signed   By: Marijo Conception, M.D.   On: 12/02/2015 15:50   Ct Head Wo Contrast  11/20/2015  CLINICAL DATA:  Patient found on floor. Concern for head or cervical spine injury. Initial encounter. EXAM: CT HEAD WITHOUT CONTRAST CT CERVICAL SPINE WITHOUT CONTRAST TECHNIQUE: Multidetector CT imaging of the head and cervical spine was performed following the standard protocol without intravenous contrast. Multiplanar CT image reconstructions of the cervical spine were also generated. COMPARISON:  None. FINDINGS: CT HEAD FINDINGS There is no evidence of acute infarction, mass lesion, or intra- or extra-axial hemorrhage on CT. Prominence of the ventricles and sulci reflects mild cortical volume loss. Mild cerebellar atrophy is noted. Scattered periventricular and  subcortical matter change likely reflects small vessel ischemic microangiopathy. The brainstem and fourth ventricle are within normal limits. The basal ganglia are unremarkable in appearance. The cerebral hemispheres demonstrate grossly normal gray-white differentiation. No mass effect or midline shift is seen. There is no evidence of fracture; visualized osseous structures are unremarkable in appearance. The  orbits are within normal limits. The paranasal sinuses and mastoid air cells are well-aerated. No significant soft tissue abnormalities are seen. CT CERVICAL SPINE FINDINGS There is no evidence of acute fracture or subluxation. Vertebral bodies demonstrate normal height. Mild grade 1 anterolisthesis is noted of C3 on C4 and of C4 on C5. Multilevel disc space narrowing is noted along the cervical spine, with scattered anterior and posterior disc osteophyte complexes. Prevertebral soft tissues are within normal limits. Facet disease is noted along the cervical spine. The thyroid gland is unremarkable in appearance. The visualized lung apices are clear. Scattered calcification is noted at the carotid bifurcations bilaterally. IMPRESSION: 1. No evidence of traumatic intracranial injury or fracture. 2. No evidence of acute fracture or subluxation along the cervical spine. 3. Mild cortical volume loss and scattered small vessel ischemic microangiopathy. 4. Mild degenerative change along the cervical spine. 5. Scattered calcification at the carotid bifurcations bilaterally. Carotid ultrasound would be helpful for further evaluation, when and as deemed clinically appropriate. Electronically Signed   By: Garald Balding M.D.   On: 11/20/2015 02:54   Ct Chest W Contrast  11/20/2015  CLINICAL DATA:  Elevated LFTs, transaminitis, jaundice, total bilirubin =24.2, history hypertension EXAM: CT CHEST, ABDOMEN, AND PELVIS WITH CONTRAST TECHNIQUE: Multidetector CT imaging of the chest, abdomen and pelvis was performed following the standard protocol during bolus administration of intravenous contrast. Sagittal and coronal MPR images reconstructed from axial data set. CONTRAST:  143m ISOVUE-300 IOPAMIDOL (ISOVUE-300) INJECTION 61% IV. Dilute oral contrast. COMPARISON:  None FINDINGS: CT CHEST FINDINGS Cardiovascular: Significant atherosclerotic calcification aorta, coronary arteries and great vessels. Large dense calcified plaque  at innominate artery. No aortic aneurysm or dissection. Pulmonary arteries grossly patent on nondedicated exam. Mediastinum/Lymph Nodes: Tracheobronchial cartilage calcification extending into hila. No thoracic adenopathy. Esophagus unremarkable. Lungs/Pleura: Lobulated mass with slightly spiculated margins in RIGHT lower lobe, partially chronic center, overall 2.2 x 2.2 x 2.4 cm compatible with tumor, question primary versus metastatic. Additional nodule with slightly ill-defined margins 7 mm diameter anterior RIGHT lower lobe image 54. Questionable subpleural nodule lingula 4 mm diameter image 57. Minimal bibasilar atelectasis. No infiltrate, pleural effusion, or pneumothorax. Musculoskeletal: Calcified subcoracoid loose body RIGHT shoulder with RIGHT glenohumeral degenerative changes. Advanced glenohumeral degenerative changes with spurring and calcific debris at LEFT glenohumeral joint. Diffuse osseous demineralization. No destructive bone lesions. CT ABDOMEN PELVIS FINDINGS Hepatobiliary: Marked intrahepatic and extrahepatic biliary dilatation. CBD 3.0 cm transverse. Poorly defined low-attenuation lesion at medial RIGHT lobe liver 14 x 12 mm question metastasis. Pancreas: Poorly defined low-attenuation mass at head and uncinate process of pancreas, 3.1 x 3.3 x 3.1 cm highly suspicious for a primary pancreatic neoplasm. Marked atrophy of the pancreatic body and tail with significant pancreatic ductal dilatation up to 11 mm diameter. Mass abuts the SMA and SMV as well as anterior margin of the aorta and third portion of duodenum. Spleen: Normal appearance Adrenals/Urinary Tract: Exophytic cyst lateral RIGHT kidney 2.0 x 1.4 cm image 71. Kidneys and adrenal glands otherwise normal appearance. No hydronephrosis or hydroureter. No urinary tract calcification. Bladder well distended and unremarkable. Stomach/Bowel: Descending duodenum displaced laterally by dilated CBD. Stomach and bowel loops otherwise grossly  normal appearance Vascular/Lymphatic: Extensive atherosclerotic calcifications aorta and abdominal branch vessels. Reproductive: Uterus surgically absent with nonvisualization of ovaries Other: No free air or free fluid Musculoskeletal: Degenerative disc disease changes greatest at L3-L41. No destructive bone lesions. IMPRESSION: Mass at head and uncinate process of pancreas 3.1 x 3.3 x 3.1 cm highly suspicious for a primary pancreatic neoplasm with associated marked intrahepatic and extrahepatic biliary dilatation. Suspected RIGHT lower lobe and hepatic metastases as above. Aortic atherosclerosis and coronary arterial atherosclerotic cyst. Findings discussed with Dr. Gala Romney prior to dictation of this report on 11/20/2015. Electronically Signed   By: Lavonia Dana M.D.   On: 11/20/2015 13:21   Ct Cervical Spine Wo Contrast  11/20/2015  CLINICAL DATA:  Patient found on floor. Concern for head or cervical spine injury. Initial encounter. EXAM: CT HEAD WITHOUT CONTRAST CT CERVICAL SPINE WITHOUT CONTRAST TECHNIQUE: Multidetector CT imaging of the head and cervical spine was performed following the standard protocol without intravenous contrast. Multiplanar CT image reconstructions of the cervical spine were also generated. COMPARISON:  None. FINDINGS: CT HEAD FINDINGS There is no evidence of acute infarction, mass lesion, or intra- or extra-axial hemorrhage on CT. Prominence of the ventricles and sulci reflects mild cortical volume loss. Mild cerebellar atrophy is noted. Scattered periventricular and subcortical matter change likely reflects small vessel ischemic microangiopathy. The brainstem and fourth ventricle are within normal limits. The basal ganglia are unremarkable in appearance. The cerebral hemispheres demonstrate grossly normal gray-white differentiation. No mass effect or midline shift is seen. There is no evidence of fracture; visualized osseous structures are unremarkable in appearance. The orbits are  within normal limits. The paranasal sinuses and mastoid air cells are well-aerated. No significant soft tissue abnormalities are seen. CT CERVICAL SPINE FINDINGS There is no evidence of acute fracture or subluxation. Vertebral bodies demonstrate normal height. Mild grade 1 anterolisthesis is noted of C3 on C4 and of C4 on C5. Multilevel disc space narrowing is noted along the cervical spine, with scattered anterior and posterior disc osteophyte complexes. Prevertebral soft tissues are within normal limits. Facet disease is noted along the cervical spine. The thyroid gland is unremarkable in appearance. The visualized lung apices are clear. Scattered calcification is noted at the carotid bifurcations bilaterally. IMPRESSION: 1. No evidence of traumatic intracranial injury or fracture. 2. No evidence of acute fracture or subluxation along the cervical spine. 3. Mild cortical volume loss and scattered small vessel ischemic microangiopathy. 4. Mild degenerative change along the cervical spine. 5. Scattered calcification at the carotid bifurcations bilaterally. Carotid ultrasound would be helpful for further evaluation, when and as deemed clinically appropriate. Electronically Signed   By: Garald Balding M.D.   On: 11/20/2015 02:54   Ct Abdomen Pelvis W Contrast  11/20/2015  CLINICAL DATA:  Elevated LFTs, transaminitis, jaundice, total bilirubin =24.2, history hypertension EXAM: CT CHEST, ABDOMEN, AND PELVIS WITH CONTRAST TECHNIQUE: Multidetector CT imaging of the chest, abdomen and pelvis was performed following the standard protocol during bolus administration of intravenous contrast. Sagittal and coronal MPR images reconstructed from axial data set. CONTRAST:  19m ISOVUE-300 IOPAMIDOL (ISOVUE-300) INJECTION 61% IV. Dilute oral contrast. COMPARISON:  None FINDINGS: CT CHEST FINDINGS Cardiovascular: Significant atherosclerotic calcification aorta, coronary arteries and great vessels. Large dense calcified plaque at  innominate artery. No aortic aneurysm or dissection. Pulmonary arteries grossly patent on nondedicated exam. Mediastinum/Lymph Nodes: Tracheobronchial cartilage calcification extending into hila. No thoracic adenopathy. Esophagus unremarkable. Lungs/Pleura: Lobulated mass with slightly spiculated margins in RIGHT lower lobe, partially chronic center, overall  2.2 x 2.2 x 2.4 cm compatible with tumor, question primary versus metastatic. Additional nodule with slightly ill-defined margins 7 mm diameter anterior RIGHT lower lobe image 54. Questionable subpleural nodule lingula 4 mm diameter image 57. Minimal bibasilar atelectasis. No infiltrate, pleural effusion, or pneumothorax. Musculoskeletal: Calcified subcoracoid loose body RIGHT shoulder with RIGHT glenohumeral degenerative changes. Advanced glenohumeral degenerative changes with spurring and calcific debris at LEFT glenohumeral joint. Diffuse osseous demineralization. No destructive bone lesions. CT ABDOMEN PELVIS FINDINGS Hepatobiliary: Marked intrahepatic and extrahepatic biliary dilatation. CBD 3.0 cm transverse. Poorly defined low-attenuation lesion at medial RIGHT lobe liver 14 x 12 mm question metastasis. Pancreas: Poorly defined low-attenuation mass at head and uncinate process of pancreas, 3.1 x 3.3 x 3.1 cm highly suspicious for a primary pancreatic neoplasm. Marked atrophy of the pancreatic body and tail with significant pancreatic ductal dilatation up to 11 mm diameter. Mass abuts the SMA and SMV as well as anterior margin of the aorta and third portion of duodenum. Spleen: Normal appearance Adrenals/Urinary Tract: Exophytic cyst lateral RIGHT kidney 2.0 x 1.4 cm image 71. Kidneys and adrenal glands otherwise normal appearance. No hydronephrosis or hydroureter. No urinary tract calcification. Bladder well distended and unremarkable. Stomach/Bowel: Descending duodenum displaced laterally by dilated CBD. Stomach and bowel loops otherwise grossly normal  appearance Vascular/Lymphatic: Extensive atherosclerotic calcifications aorta and abdominal branch vessels. Reproductive: Uterus surgically absent with nonvisualization of ovaries Other: No free air or free fluid Musculoskeletal: Degenerative disc disease changes greatest at L3-L41. No destructive bone lesions. IMPRESSION: Mass at head and uncinate process of pancreas 3.1 x 3.3 x 3.1 cm highly suspicious for a primary pancreatic neoplasm with associated marked intrahepatic and extrahepatic biliary dilatation. Suspected RIGHT lower lobe and hepatic metastases as above. Aortic atherosclerosis and coronary arterial atherosclerotic cyst. Findings discussed with Dr. Gala Romney prior to dictation of this report on 11/20/2015. Electronically Signed   By: Lavonia Dana M.D.   On: 11/20/2015 13:21   Dg Ercp Biliary & Pancreatic Ducts  11/21/2015  CLINICAL DATA:  ERCP with sphincterotomy and stent placement for pancreatic cancer EXAM: ERCP TECHNIQUE: Multiple spot images obtained with the fluoroscopic device and submitted for interpretation post-procedure. FLUOROSCOPY TIME:  Radiation Exposure Index (as provided by the fluoroscopic device): 131 mGy COMPARISON:  11/20/2015 FINDINGS: Sequential images obtained during ERCP show catheterization of the common bile duct. Contrast injection demonstrates a markedly dilated common bile duct with abrupt cut off at the expected level of the pancreatic head. On the final images metallic stent placement can be seen. IMPRESSION: 1. ERCP demonstrates common bile duct obstruction secondary to pancreatic head mass. A metallic stent is placed across the level of obstruction. These images were submitted for radiologic interpretation only. Please see the procedural report for the amount of contrast and the fluoroscopy time utilized. Electronically Signed   By: Kerby Moors M.D.   On: 11/21/2015 16:29    Microbiology: Recent Results (from the past 240 hour(s))  MRSA PCR Screening     Status:  None   Collection Time: 12/02/15  9:29 PM  Result Value Ref Range Status   MRSA by PCR NEGATIVE NEGATIVE Final    Comment:        The GeneXpert MRSA Assay (FDA approved for NASAL specimens only), is one component of a comprehensive MRSA colonization surveillance program. It is not intended to diagnose MRSA infection nor to guide or monitor treatment for MRSA infections.      Labs: Basic Metabolic Panel:  Recent Labs Lab 12/02/15 1412  12/02/15 2045 12/03/15 0654 12/03/15 1941 12/04/15 0652 12/06/15 0552  NA  --   < > 126* 128* 130* 131* 133*  K  --   < > 3.3* 3.8 3.9 3.9 2.6*  CL  --   < > 88* 92* 94* 95* 95*  CO2  --   < > 28 30 30 30 31   GLUCOSE  --   < > 148* 113* 200* 148* 121*  BUN  --   < > 14 13 13 15 7   CREATININE  --   < > 0.54 0.54 0.65 0.56 0.48  CALCIUM  --   < > 8.8* 8.6* 8.5* 8.2* 7.7*  MG 1.4*  --   --   --   --   --   --   < > = values in this interval not displayed. Liver Function Tests:  Recent Labs Lab 12/02/15 1415 12/02/15 2045 12/03/15 0654 12/03/15 1941 12/04/15 0652  AST 102* 102* 83* 74* 60*  ALT 109* 105* 94* 84* 77*  ALKPHOS 294* 286* 271* 249* 250*  BILITOT 6.0* 5.8* 5.2* 4.6* 3.9*  PROT 7.0 6.9 6.3* 6.0* 5.7*  ALBUMIN 2.7* 2.9* 2.5* 2.4* 2.2*    Recent Labs Lab 12/02/15 1415  LIPASE 20    Recent Labs Lab 12/02/15 2045  AMMONIA 30   CBC:  Recent Labs Lab 12/02/15 1415 12/03/15 0158 12/03/15 0654  WBC 7.6 8.7 8.9  NEUTROABS 6.0  --   --   HGB 8.7* 9.0* 8.3*  HCT 25.2* 26.9* 24.7*  MCV 86.3 87.1 87.3  PLT 573* 600* 522*   Cardiac Enzymes:  Recent Labs Lab 12/02/15 1415 12/02/15 2045 12/03/15 0158 12/03/15 0654  TROPONINI 0.05* 0.04* 0.05* 0.04*   BNP: BNP (last 3 results) No results for input(s): BNP in the last 8760 hours.  ProBNP (last 3 results) No results for input(s): PROBNP in the last 8760 hours.  CBG: No results for input(s): GLUCAP in the last 168  hours.     Signed:  Nels Munn MD.  [facility] 12/07/2015, 2:23 PM

## 2015-12-07 NOTE — Care Management Note (Signed)
Case Management Note  Patient Details  Name: Sophia Ramirez MRN: [id number] Date of Birth: 1925/07/31  Subjective/Objective:                    Action/Plan: Patietn will discharge to [facility] , Will sign off.   Expected Discharge Date:                  Expected Discharge Plan:  [place]  In-House Referral:  Clinical Social Work  Discharge planning Services  CM Consult  Post Acute Care Choice:    Choice offered to:  Adult Children  DME Arranged:    DME Agency:     HH Arranged:    HH Agency:     Status of Service:  Completed, signed off  If discussed at [place] of Stay Meetings, dates discussed:    Additional Comments:  Alvie Heidelberg, RN 12/07/2015, 2:25 PM

## 2016-03-20 DEATH — deceased

## 2016-07-09 IMAGING — CT CT HEAD W/O CM
4 of 8 series · 15 of 47 positions shown, 17 images · non-contrast
Comparison: None.

CLINICAL DATA: Patient found on floor. Concern for head or cervical
spine injury. Initial encounter.

EXAM:
CT HEAD WITHOUT CONTRAST
CT CERVICAL SPINE WITHOUT CONTRAST
TECHNIQUE: Multidetector CT imaging of the head and cervical spine was
performed following the standard protocol without intravenous
contrast. Multiplanar CT image reconstructions of the cervical spine
were also generated.

[Series 2: head w/o · axial · non-contrast · 0.43mm/px · z∈[+224,+296]mm · 3 of 37 slices shown]
[im 10/37  brain]
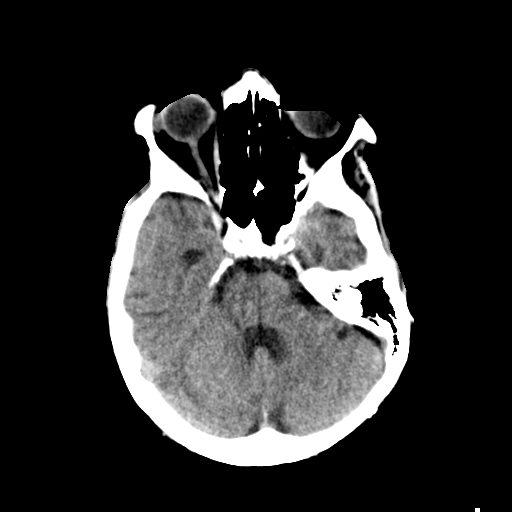
[im 19/37  brain]
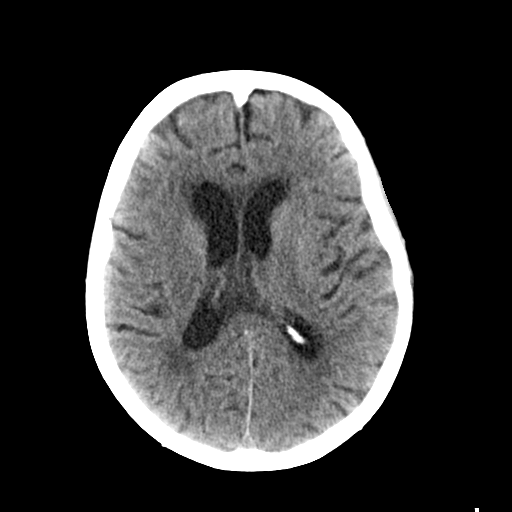
[im 28/37  brain]
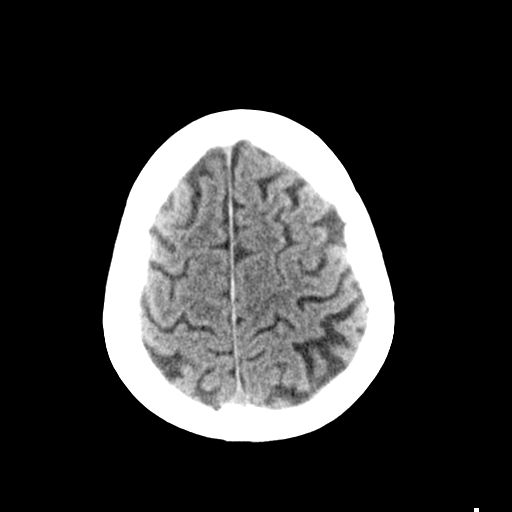

[Series 4: coronal · coronal · 0.28mm/px · 3 of 58 slices shown]
[im 17/58  brain]
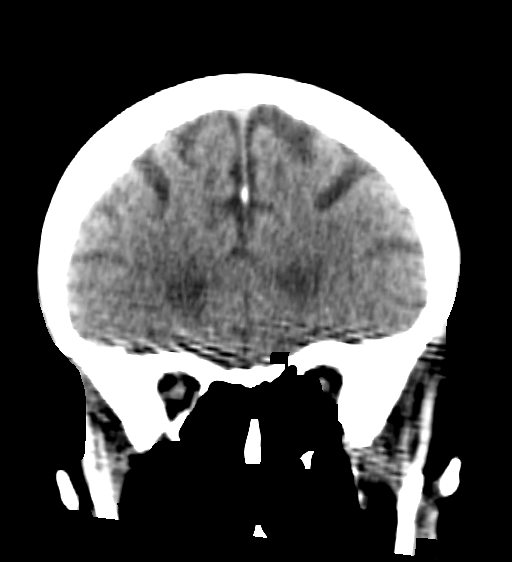
[im 25/58  brain]
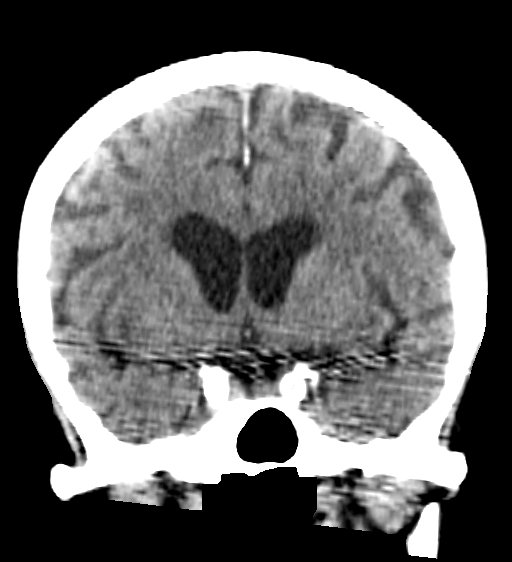
[im 33/58  brain]
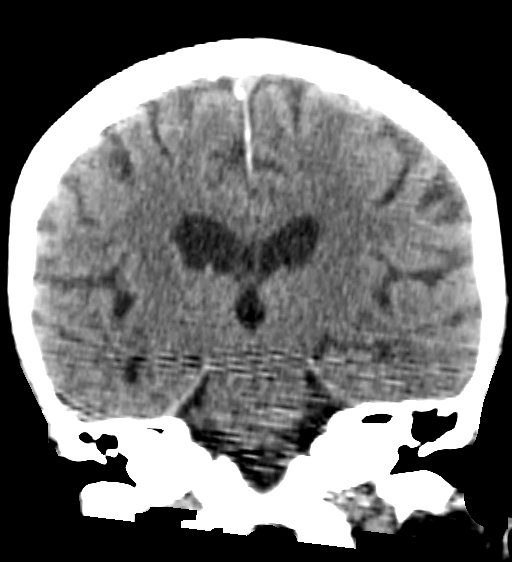

[Series 5: sagittal · sagittal · 0.32mm/px · 2 of 47 slices shown]
[im 16/47  brain]
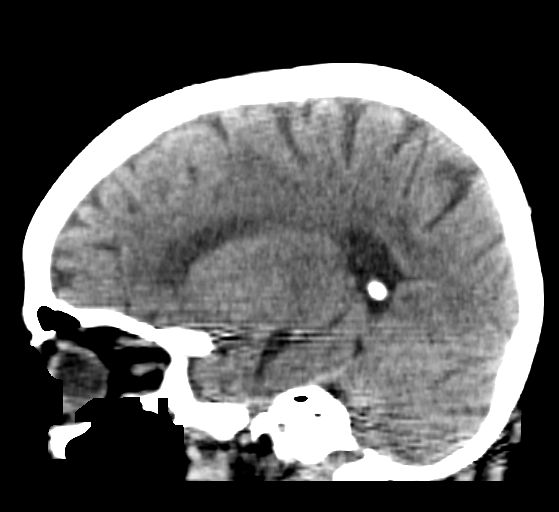
[im 31/47  brain]
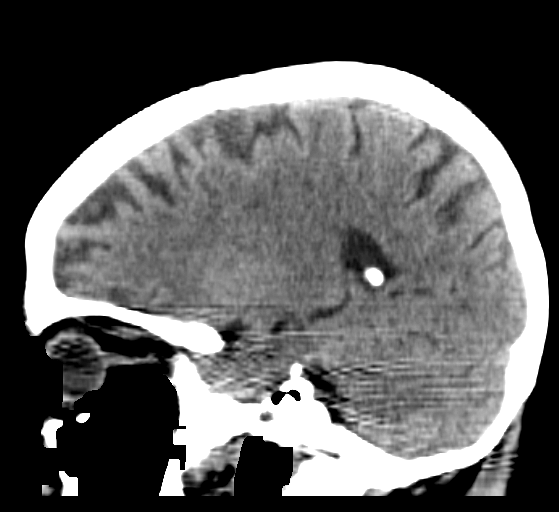

[Series 10: orthogonal axial · axial · 0.18mm/px · z∈[+67,+172]mm · 7 of 83 slices shown, 9 images]
[im 11/83  brain]
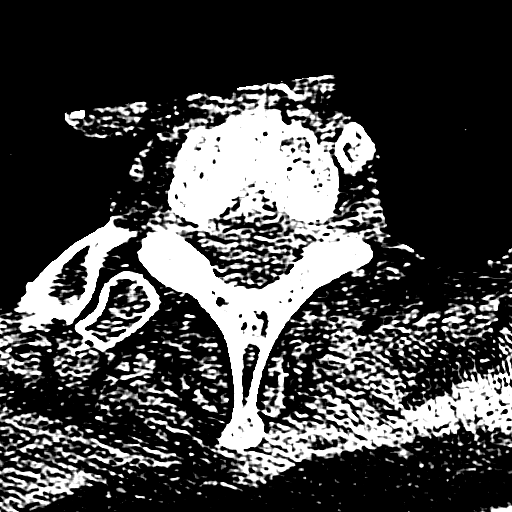
[im 11/83  bone]
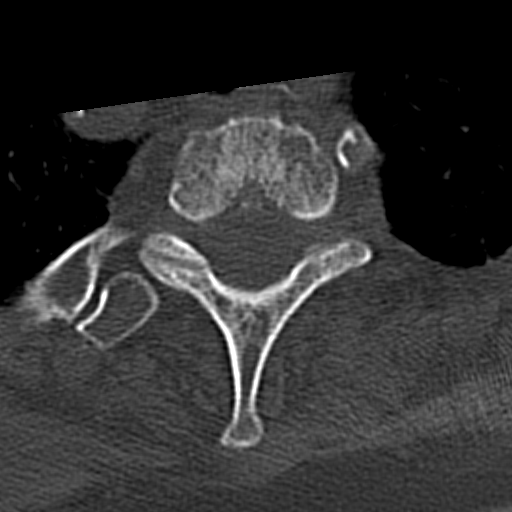
[im 21/83  brain]
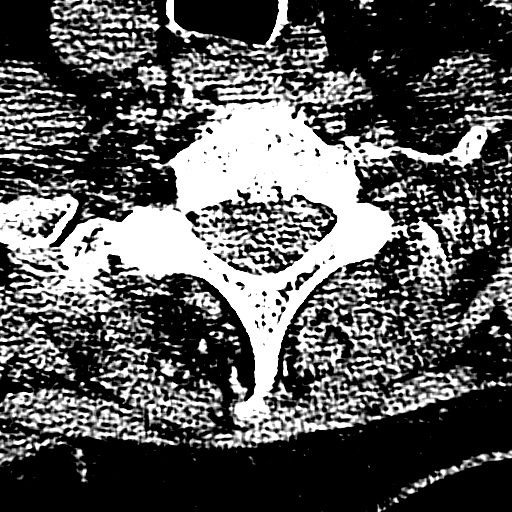
[im 31/83  brain]
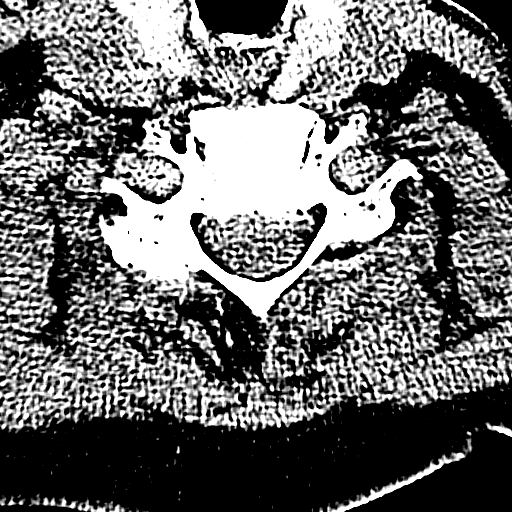
[im 42/83  brain]
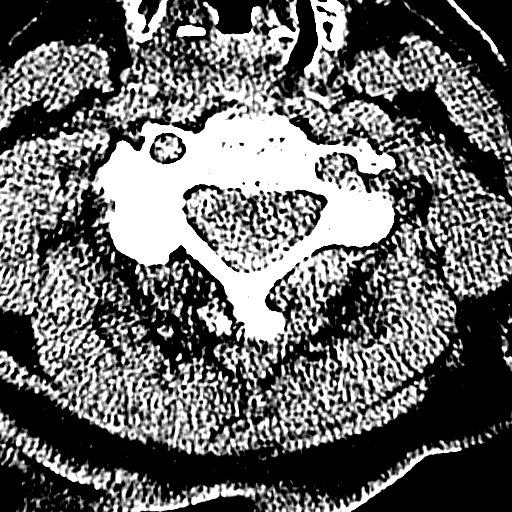
[im 52/83  brain]
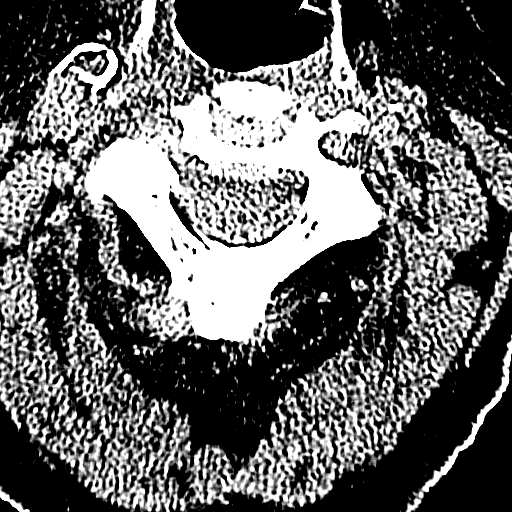
[im 52/83  bone]
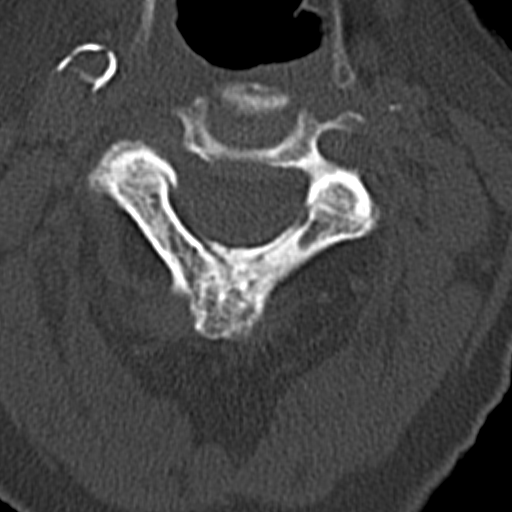
[im 62/83  brain]
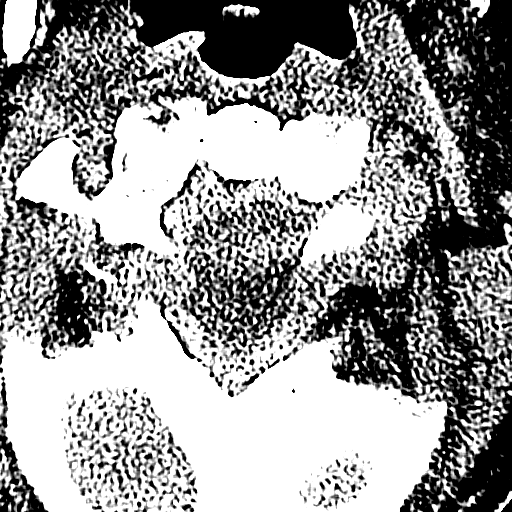
[im 72/83  brain]
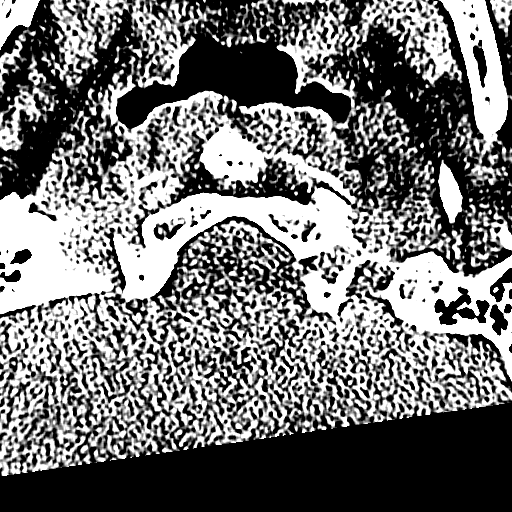

[15 of 47 positions shown; findings below may reference images not displayed]

FINDINGS: CT HEAD FINDINGS

There is no evidence of acute infarction, mass lesion, or intra- or
extra-axial hemorrhage on CT.

Prominence of the ventricles and sulci reflects mild cortical volume
loss. Mild cerebellar atrophy is noted. Scattered periventricular
and subcortical matter change likely reflects small vessel ischemic
microangiopathy.

The brainstem and fourth ventricle are within normal limits. The
basal ganglia are unremarkable in appearance. The cerebral
hemispheres demonstrate grossly normal gray-white differentiation.
No mass effect or midline shift is seen.

There is no evidence of fracture; visualized osseous structures are
unremarkable in appearance. The orbits are within normal limits. The
paranasal sinuses and mastoid air cells are well-aerated. No
significant soft tissue abnormalities are seen.

CT CERVICAL SPINE FINDINGS

There is no evidence of acute fracture or subluxation. Vertebral
bodies demonstrate normal height. Mild grade 1 anterolisthesis is
noted of C3 on C4 and of C4 on C5. Multilevel disc space narrowing
is noted along the cervical spine, with scattered anterior and
posterior disc osteophyte complexes. Prevertebral soft tissues are
within normal limits. Facet disease is noted along the cervical
spine.

The thyroid gland is unremarkable in appearance. The visualized lung
apices are clear. Scattered calcification is noted at the carotid
bifurcations bilaterally.
IMPRESSION: 1. No evidence of traumatic intracranial injury or fracture.
2. No evidence of acute fracture or subluxation along the cervical
spine.
3. Mild cortical volume loss and scattered small vessel ischemic
microangiopathy.
4. Mild degenerative change along the cervical spine.
5. Scattered calcification at the carotid bifurcations bilaterally.
Carotid ultrasound would be helpful for further evaluation, when and
as deemed clinically appropriate.
